# Patient Record
Sex: Female | Born: 1970 | Race: White | Hispanic: Yes | Marital: Married | Smoking: Never smoker
Health system: Southern US, Community
[De-identification: ages and names within clinical notes are randomized; demographics above are authoritative.]

## PROBLEM LIST (undated history)

## (undated) DIAGNOSIS — D649 Anemia, unspecified: Secondary | ICD-10-CM

## (undated) DIAGNOSIS — D219 Benign neoplasm of connective and other soft tissue, unspecified: Secondary | ICD-10-CM

## (undated) HISTORY — PX: TUBAL LIGATION: SHX77

## (undated) HISTORY — PX: HERNIA REPAIR: SHX51

## (undated) HISTORY — DX: Benign neoplasm of connective and other soft tissue, unspecified: D21.9

## (undated) HISTORY — DX: Anemia, unspecified: D64.9

---

## 2007-07-20 ENCOUNTER — Emergency Department (HOSPITAL_COMMUNITY): Admission: EM | Admit: 2007-07-20 | Discharge: 2007-07-20 | Payer: Self-pay | Admitting: Emergency Medicine

## 2010-09-04 ENCOUNTER — Other Ambulatory Visit: Payer: Self-pay | Admitting: *Deleted

## 2010-09-19 ENCOUNTER — Other Ambulatory Visit (HOSPITAL_COMMUNITY): Payer: Self-pay | Admitting: Family Medicine

## 2010-09-19 DIAGNOSIS — Z1231 Encounter for screening mammogram for malignant neoplasm of breast: Secondary | ICD-10-CM

## 2010-09-20 ENCOUNTER — Other Ambulatory Visit (HOSPITAL_COMMUNITY): Payer: Self-pay | Admitting: Family Medicine

## 2010-09-20 DIAGNOSIS — Z1231 Encounter for screening mammogram for malignant neoplasm of breast: Secondary | ICD-10-CM

## 2010-09-23 ENCOUNTER — Other Ambulatory Visit (HOSPITAL_COMMUNITY): Payer: Self-pay | Admitting: *Deleted

## 2010-09-23 DIAGNOSIS — Z1239 Encounter for other screening for malignant neoplasm of breast: Secondary | ICD-10-CM

## 2010-09-26 ENCOUNTER — Ambulatory Visit (HOSPITAL_COMMUNITY)
Admission: RE | Admit: 2010-09-26 | Discharge: 2010-09-26 | Disposition: A | Discharge status: Home / Self Care | Payer: Self-pay | Source: Ambulatory Visit | Attending: Family Medicine | Admitting: Family Medicine

## 2010-09-26 ENCOUNTER — Ambulatory Visit (HOSPITAL_COMMUNITY): Payer: Self-pay

## 2010-09-26 DIAGNOSIS — Z1231 Encounter for screening mammogram for malignant neoplasm of breast: Secondary | ICD-10-CM

## 2012-09-02 ENCOUNTER — Other Ambulatory Visit (HOSPITAL_COMMUNITY): Payer: Self-pay | Admitting: *Deleted

## 2012-09-02 ENCOUNTER — Other Ambulatory Visit (HOSPITAL_COMMUNITY): Payer: Self-pay | Admitting: Family Medicine

## 2012-09-02 DIAGNOSIS — Z1231 Encounter for screening mammogram for malignant neoplasm of breast: Secondary | ICD-10-CM

## 2012-09-09 ENCOUNTER — Ambulatory Visit (HOSPITAL_COMMUNITY)
Admission: RE | Admit: 2012-09-09 | Discharge: 2012-09-09 | Disposition: A | Discharge status: Home / Self Care | Payer: Self-pay | Source: Ambulatory Visit | Attending: Family Medicine | Admitting: Family Medicine

## 2012-09-09 DIAGNOSIS — Z1231 Encounter for screening mammogram for malignant neoplasm of breast: Secondary | ICD-10-CM

## 2012-09-13 ENCOUNTER — Other Ambulatory Visit: Payer: Self-pay | Admitting: Family Medicine

## 2012-09-13 DIAGNOSIS — R928 Other abnormal and inconclusive findings on diagnostic imaging of breast: Secondary | ICD-10-CM

## 2012-09-22 ENCOUNTER — Ambulatory Visit (HOSPITAL_COMMUNITY)
Admission: RE | Admit: 2012-09-22 | Discharge: 2012-09-22 | Disposition: A | Discharge status: Home / Self Care | Payer: Self-pay | Source: Ambulatory Visit | Attending: Family Medicine | Admitting: Family Medicine

## 2012-09-22 DIAGNOSIS — R928 Other abnormal and inconclusive findings on diagnostic imaging of breast: Secondary | ICD-10-CM | POA: Insufficient documentation

## 2013-10-11 ENCOUNTER — Other Ambulatory Visit (HOSPITAL_COMMUNITY): Payer: Self-pay | Admitting: Physician Assistant

## 2013-10-11 DIAGNOSIS — Z1231 Encounter for screening mammogram for malignant neoplasm of breast: Secondary | ICD-10-CM

## 2013-10-18 ENCOUNTER — Ambulatory Visit (HOSPITAL_COMMUNITY)
Admission: RE | Admit: 2013-10-18 | Discharge: 2013-10-18 | Disposition: A | Discharge status: Home / Self Care | Payer: Self-pay | Source: Ambulatory Visit | Attending: Physician Assistant | Admitting: Physician Assistant

## 2013-10-18 DIAGNOSIS — Z1231 Encounter for screening mammogram for malignant neoplasm of breast: Secondary | ICD-10-CM

## 2014-09-27 ENCOUNTER — Other Ambulatory Visit (HOSPITAL_COMMUNITY): Payer: Self-pay | Admitting: Physician Assistant

## 2014-09-27 DIAGNOSIS — Z1213 Encounter for screening for malignant neoplasm of small intestine: Secondary | ICD-10-CM

## 2014-10-26 ENCOUNTER — Ambulatory Visit (HOSPITAL_COMMUNITY): Payer: Self-pay

## 2014-11-02 ENCOUNTER — Ambulatory Visit (HOSPITAL_COMMUNITY)
Admission: RE | Admit: 2014-11-02 | Discharge: 2014-11-02 | Disposition: A | Discharge status: Home / Self Care | Payer: Self-pay | Source: Ambulatory Visit | Attending: Physician Assistant | Admitting: Physician Assistant

## 2014-11-02 DIAGNOSIS — Z1213 Encounter for screening for malignant neoplasm of small intestine: Secondary | ICD-10-CM

## 2015-09-06 ENCOUNTER — Ambulatory Visit: Payer: Self-pay | Admitting: Physician Assistant

## 2015-09-11 ENCOUNTER — Encounter: Payer: Self-pay | Admitting: Physician Assistant

## 2015-10-03 ENCOUNTER — Ambulatory Visit: Payer: Self-pay | Admitting: Physician Assistant

## 2015-10-10 ENCOUNTER — Encounter: Payer: Self-pay | Admitting: Physician Assistant

## 2015-10-10 ENCOUNTER — Ambulatory Visit: Payer: Self-pay | Admitting: Physician Assistant

## 2015-10-10 VITALS — BP 102/68 | HR 81 | Temp 98.1°F | Ht 60.5 in | Wt 143.6 lb

## 2015-10-10 DIAGNOSIS — K5900 Constipation, unspecified: Secondary | ICD-10-CM

## 2015-10-10 DIAGNOSIS — D649 Anemia, unspecified: Secondary | ICD-10-CM

## 2015-10-10 DIAGNOSIS — M542 Cervicalgia: Secondary | ICD-10-CM

## 2015-10-10 DIAGNOSIS — N921 Excessive and frequent menstruation with irregular cycle: Secondary | ICD-10-CM

## 2015-10-10 DIAGNOSIS — Z1239 Encounter for other screening for malignant neoplasm of breast: Secondary | ICD-10-CM

## 2015-10-10 NOTE — Patient Instructions (Addendum)
Start over-the-counter IRON supplement and take it every day empieze a tomar hiero todos los dias   Anemia inespecfica (Anemia, Nonspecific) La anemia es una enfermedad en la que la concentracin de glbulos rojos o el nivel de hemoglobina en la sangre estn por debajo de lo normal. La hemoglobina es la sustancia de los glbulos rojos que lleva el oxgeno a todo el cuerpo. La anemia da como resultado que los tejidos no reciban la cantidad suficiente de oxgeno.  CAUSAS  Las causas ms frecuentes de anemia son:   Elvina Mattes. El sangrado puede ser interno o externo. Incluye sangrado excesivo debido al perodo (en las mujeres) o por los intestinos.   Dficit nutricional.   Enfermedad renal, tiroidea o heptica crnicas.  Enfermedades de la mdula sea que disminuyen la produccin de glbulos rojos.  Cncer y tratamientos para Science writer.  VIH, sida y sus tratamientos.  Trastornos del bazo que aumentan la destruccin de glbulos rojos.  Enfermedades de [employer].  Destruccin excesiva de glbulos rojos debido a una infeccin, a medicamentos y a Nurse, mental health. SIGNOS Y SNTOMAS   Debilidad leve.   Mareos.   Dolor de [place].  Palpitaciones.   Falta de aire, especialmente con el ejercicio.   Palidez.  Sensibilidad al fro.  Indigestin.  Nuseas.  Dificultad para dormir.  Dificultad para concentrarse. Los sntomas pueden ocurrir repentinamente o pueden Psychologist, forensic.  DIAGNSTICO  Con frecuencia es necesario realizar anlisis de [employer]. Estos ayudan al profesional a Adult nurse. Su mdico controlar la materia fecal para Hydrographic surveyor la presencia de [place] y buscar otras causas de prdida de [place].  TRATAMIENTO  El tratamiento vara segn la causa de la anemia. Las opciones de tratamiento son:   Suplementos de hierro, vitamina [id number], o cido flico.   Medicamentos con hormonas.   Transfusin de  [place]. Ser necesaria en los casos de prdida de [place] grave.   Hospitalizacin. Ser necesaria si la prdida de sangre es continua y significativa.   Cambios en la dieta.  Extirpacin del bazo. INSTRUCCIONES PARA EL CUIDADO EN EL HOGAR Cumpla con todas las visitas de control. Generalmente demora varias semanas corregir la anemia, y es muy importante que el mdico controle su enfermedad y su respuesta al [place]. SOLICITE ATENCIN MDICA DE INMEDIATO SI:   Siente debilidad extrema, falta de aire o dolor en el pecho.   Se siente mareado o tiene dificultad para concentrarse.  Tiene una hemorragia vaginal abundante.   Aparece una erupcin cutnea.   La materia fecal es negra, de aspecto alquitranado.   Se desmaya.   Vomita sangre.   Vomita repetidas veces.   Siente dolor abdominal.  Tiene fiebre o sntomas persistentes durante ms de 2 - 3 das.   Tiene fiebre y los sntomas empeoran repentinamente.   Se deshidrata.  ASEGRESE DE QUE:  Comprende estas instrucciones.  Controlar su afeccin.  Recibir ayuda de inmediato si no mejora o si empeora.   Esta informacin no tiene Marine scientist el consejo del mdico. Asegrese de hacerle al mdico cualquier pregunta que tenga.   Document Released: 06/02/2005 Document Revised: 02/02/2013 Elsevier Interactive Patient Education 2016 [place] (Constipation, Adult) Estreimiento significa que una persona tiene menos de tres evacuaciones en una semana, dificultad para defecar, o que las heces son secas, duras, o ms grandes que lo normal. A medida que envejecemos el estreimiento es ms comn. Una dieta baja en fibra, no tomar suficientes lquidos y el  uso de ciertos medicamentos pueden Agricultural engineer.  CAUSAS   Ciertos medicamentos, como los antidepresivos, analgsicos, suplementos de hierro, anticidos y diurticos.  Algunas enfermedades, como la diabetes, el  sndrome del colon irritable, enfermedad de la tiroides, o depresin.  No beber suficiente agua.  No consumir suficientes alimentos ricos en fibra.  Situaciones de estrs o viajes.  Falta de actividad fsica o de ejercicio.  Ignorar la necesidad sbita de Landscape architect.  Uso en exceso de laxantes. SIGNOS Y SNTOMAS   Defecar menos de tres veces por semana.  Dificultad para defecar.  Tener las heces secas y duras, o ms grandes que las normales.  Sensacin de estar lleno o hinchado.  Dolor en la parte baja del abdomen.  No sentir alivio despus de defecar. DIAGNSTICO  El mdico le har una historia clnica y un examen fsico. Pueden hacerle exmenes adicionales para el estreimiento grave. Estos estudios pueden ser:  Un radiografa con enema de bario para examinar el recto, el colon y, en algunos casos, el intestino delgado.  Una sigmoidoscopia para examinar el colon inferior.  Una colonoscopia para examinar todo el colon. TRATAMIENTO  El tratamiento depender de la gravedad del estreimiento y de la causa. Algunos tratamientos nutricionales son beber ms lquidos y comer ms alimentos ricos en fibra. El cambio en el estilo de vida incluye hacer ejercicios de [place] regular. Si estas recomendaciones para Animator dieta y en el estilo de vida no ayudan, el mdico le puede indicar el uso de laxantes de venta libre para ayudarlo a Landscape architect. Los medicamentos recetados se pueden prescribir si los medicamentos de venta libre no lo [place].  INSTRUCCIONES PARA EL CUIDADO EN EL HOGAR   Consuma alimentos con alto contenido de [place], como frutas, vegetales, cereales integrales y porotos.  Limite los alimentos procesados ricos en grasas y azcar, como las papas fritas, hamburguesas, galletas, dulces y refrescos.  Puede agregar un suplemento de fibra a su dieta si no obtiene lo suficiente de los alimentos.  Beba suficiente lquido para Consulting civil engineer orina clara o de color amarillo  plido.  Haga ejercicio regularmente o segn las indicaciones del mdico.  Vaya al bao cuando sienta la necesidad de ir. No se aguante las ganas.  Tome solo medicamentos de venta libre o recetados, segn las indicaciones del mdico. No tome otros medicamentos para el estreimiento sin consultarlo antes con su mdico. SOLICITE ATENCIN MDICA DE INMEDIATO SI:   Observa sangre brillante en las heces.  El estreimiento dura ms de 4 das o [place].  Siente dolor abdominal o rectal.  Las heces son delgadas como un lpiz.  Pierde peso de [place] inexplicable. ASEGRESE DE QUE:   Comprende estas instrucciones.  Controlar su afeccin.  Recibir ayuda de inmediato si no mejora o si empeora.   Esta informacin no tiene Marine scientist el consejo del mdico. Asegrese de hacerle al mdico cualquier pregunta que tenga.   Document Released: 06/22/2007 Document Revised: 06/23/2014 Elsevier Interactive Patient Education [employer].

## 2015-10-10 NOTE — Progress Notes (Signed)
BP 102/68 mmHg  Pulse 81  Temp(Src) 98.1 F (36.7 C)  Ht 5' 0.5" (1.537 m)  Wt 143 lb 9.6 oz (65.137 kg)  BMI 27.57 kg/m2  SpO2 98%   Subjective:    Patient ID: Judy Mcdaniel, female    DOB: [birth year], 45 y.o.   MRN: [id number]  HPI: Judy Mcdaniel is a 45 y.o. female presenting on 10/10/2015 for Follow-up   HPI   Pt requesting rx for IBU and flexeril that she got in the ER (in [place]) last year.  States has neck pain at times   Pt c/o constipation.  States she  has BM qod.     Relevant past medical, surgical, family and social history reviewed and updated as indicated. Interim medical history since our last visit reviewed. Allergies and medications reviewed and updated.  Current outpatient prescriptions:  .  ASPIRIN-ACETAMINOPHEN-CAFFEINE PO, Take by mouth., Disp: , Rfl:    Review of Systems  Constitutional: Negative for fever, chills, diaphoresis, appetite change, fatigue and unexpected weight change.  HENT: Positive for dental problem. Negative for congestion, drooling, ear pain, facial swelling, hearing loss, mouth sores, sneezing, sore throat, trouble swallowing and voice change.   Eyes: Positive for redness and itching. Negative for pain, discharge and visual disturbance.  Respiratory: Negative for cough, choking, shortness of breath and wheezing.   Cardiovascular: Negative for chest pain, palpitations and leg swelling.  Gastrointestinal: Positive for constipation. Negative for vomiting, abdominal pain, diarrhea and blood in stool.  Endocrine: Negative for cold intolerance, heat intolerance and polydipsia.  Genitourinary: Negative for dysuria, hematuria and decreased urine volume.  Musculoskeletal: Positive for back pain. Negative for arthralgias and gait problem.  Skin: Negative for rash.  Allergic/Immunologic: Negative for environmental allergies.  Neurological: Negative for seizures, syncope, light-headedness and headaches.  Hematological: Negative for  adenopathy.  Psychiatric/Behavioral: Negative for suicidal ideas, dysphoric mood and agitation. The patient is nervous/anxious.     Per HPI unless specifically indicated above     Objective:    BP 102/68 mmHg  Pulse 81  Temp(Src) 98.1 F (36.7 C)  Ht 5' 0.5" (1.537 m)  Wt 143 lb 9.6 oz (65.137 kg)  BMI 27.57 kg/m2  SpO2 98%  Wt Readings from Last 3 Encounters:  10/10/15 143 lb 9.6 oz (65.137 kg)    Physical Exam  Constitutional: She is oriented to person, place, and time. She appears well-developed and well-nourished.  HENT:  Head: Normocephalic and atraumatic.  Neck: Neck supple.  Cardiovascular: Normal rate and regular rhythm.   Pulmonary/Chest: Effort normal and breath sounds normal.  Abdominal: Soft. Bowel sounds are normal. She exhibits no mass. There is no hepatosplenomegaly. There is no tenderness.  Musculoskeletal: She exhibits no edema.       Cervical back: Normal. She exhibits normal range of motion, no tenderness, no bony tenderness, no swelling, no edema and no deformity.  Lymphadenopathy:    She has no cervical adenopathy.  Neurological: She is alert and oriented to person, place, and time.  Skin: Skin is warm and dry.  Psychiatric: She has a normal mood and affect. Her behavior is normal.  Vitals reviewed.   No results found for this or any previous visit.    Assessment & Plan:   Encounter Diagnoses  Name Primary?  Marland Kitchen Anemia, unspecified anemia type Yes  . Constipation, unspecified constipation type   . Menorrhagia with irregular cycle   . Screening for breast cancer   . Neck pain      -Counseled on  constipation and gave handout . Recommended increasing water intake and adding fiber -mammogram after may 20 -Reviewed labs (not in EPIC- were faxed from [facility])-  Hg 7.8, lipids good-  - pt states no blood in stool, menses heavy- lasts 7 days-  First 2 days are heaviest.   She has to use tampon and Pad because the tampon won't last and starts  overflowing onto the pad after 2-4 hours.    LMP last week.  She gets her period every 3 weeks.   -Start iron. Refer to gyn.  Pt is given a [facility] discount application -recommend heat and tylenol for pain.  No flexeril.  nsaids not recommended in light of anemia -F/u 1 month

## 2015-10-31 ENCOUNTER — Other Ambulatory Visit: Payer: Self-pay | Admitting: Student

## 2015-10-31 DIAGNOSIS — D649 Anemia, unspecified: Secondary | ICD-10-CM

## 2015-11-05 ENCOUNTER — Encounter (HOSPITAL_COMMUNITY): Payer: Self-pay

## 2015-11-08 ENCOUNTER — Ambulatory Visit (HOSPITAL_COMMUNITY): Payer: Self-pay

## 2015-11-08 ENCOUNTER — Other Ambulatory Visit: Payer: Self-pay | Admitting: Physician Assistant

## 2015-11-08 ENCOUNTER — Encounter: Payer: Self-pay | Admitting: Physician Assistant

## 2015-11-08 ENCOUNTER — Encounter (HOSPITAL_COMMUNITY): Payer: Self-pay

## 2015-11-08 ENCOUNTER — Ambulatory Visit (HOSPITAL_COMMUNITY)
Admission: RE | Admit: 2015-11-08 | Discharge: 2015-11-08 | Disposition: A | Discharge status: Home / Self Care | Payer: Self-pay | Source: Ambulatory Visit | Attending: Physician Assistant | Admitting: Physician Assistant

## 2015-11-08 ENCOUNTER — Ambulatory Visit: Payer: Self-pay | Admitting: Physician Assistant

## 2015-11-08 VITALS — BP 114/70 | HR 73 | Temp 97.9°F | Ht 60.5 in | Wt 146.8 lb

## 2015-11-08 DIAGNOSIS — N921 Excessive and frequent menstruation with irregular cycle: Secondary | ICD-10-CM

## 2015-11-08 DIAGNOSIS — Z1231 Encounter for screening mammogram for malignant neoplasm of breast: Secondary | ICD-10-CM

## 2015-11-08 DIAGNOSIS — D649 Anemia, unspecified: Secondary | ICD-10-CM

## 2015-11-08 NOTE — Progress Notes (Signed)
BP 114/70 mmHg  Pulse 73  Temp(Src) 97.9 F (36.6 C)  Ht 5' 0.5" (1.537 m)  Wt 146 lb 12.8 oz (66.588 kg)  BMI 28.19 kg/m2  SpO2 99%   Subjective:    Patient ID: Judy Mcdaniel, female    DOB: 1971/03/18, 45 y.o.   MRN: [id number]  HPI: Judy Mcdaniel is a 45 y.o. female presenting on 11/08/2015 for Anemia   HPI Pt says she went to lab and got blood drawn on Saturday.  Pt says she went to turn in [employer] disc application twice but Caryl Pina wasn't there  Relevant past medical, surgical, family and social history reviewed and updated as indicated. Interim medical history since our last visit reviewed. Allergies and medications reviewed and updated.   Current outpatient prescriptions:  .  ASPIRIN-ACETAMINOPHEN-CAFFEINE PO, Take by mouth., Disp: , Rfl:  .  IRON PO, Take 1 tablet by mouth daily., Disp: , Rfl:    Review of Systems  Constitutional: Positive for fatigue. Negative for fever, chills, diaphoresis, appetite change and unexpected weight change.  HENT: Positive for dental problem and sneezing. Negative for congestion, drooling, ear pain, facial swelling, hearing loss, mouth sores, sore throat, trouble swallowing and voice change.   Eyes: Positive for redness and itching. Negative for pain, discharge and visual disturbance.  Respiratory: Negative for cough, choking, shortness of breath and wheezing.   Cardiovascular: Negative for chest pain, palpitations and leg swelling.  Gastrointestinal: Negative for vomiting, abdominal pain, diarrhea, constipation and blood in stool.  Endocrine: Negative for cold intolerance, heat intolerance and polydipsia.  Genitourinary: Negative for dysuria, hematuria and decreased urine volume.  Musculoskeletal: Positive for back pain. Negative for arthralgias and gait problem.  Skin: Negative for rash.  Allergic/Immunologic: Negative for environmental allergies.  Neurological: Negative for seizures, syncope, light-headedness and headaches.   Hematological: Negative for adenopathy.  Psychiatric/Behavioral: Negative for suicidal ideas, dysphoric mood and agitation. The patient is not nervous/anxious.     Per HPI unless specifically indicated above     Objective:    BP 114/70 mmHg  Pulse 73  Temp(Src) 97.9 F (36.6 C)  Ht 5' 0.5" (1.537 m)  Wt 146 lb 12.8 oz (66.588 kg)  BMI 28.19 kg/m2  SpO2 99%  Wt Readings from Last 3 Encounters:  11/08/15 146 lb 12.8 oz (66.588 kg)  10/10/15 143 lb 9.6 oz (65.137 kg)    Physical Exam  Constitutional: She is oriented to person, place, and time. She appears well-developed and well-nourished.  HENT:  Head: Normocephalic and atraumatic.  Neck: Neck supple.  Cardiovascular: Normal rate and regular rhythm.   Pulmonary/Chest: Effort normal and breath sounds normal.  Abdominal: Soft. Bowel sounds are normal. She exhibits no mass. There is no hepatosplenomegaly. There is no tenderness.  Musculoskeletal: She exhibits no edema.  Lymphadenopathy:    She has no cervical adenopathy.  Neurological: She is alert and oriented to person, place, and time.  Skin: Skin is warm and dry.  Psychiatric: She has a normal mood and affect. Her behavior is normal.  Vitals reviewed.   No results found for this or any previous visit.    Assessment & Plan:    Encounter Diagnoses  Name Primary?  Marland Kitchen Anemia, unspecified anemia type Yes  . Menorrhagia with irregular cycle     -reviewed labs with pt- not in EPIC- Hg 11.5.  Also lipids good and CMP good -pt to Continue iron -pt to Turn in [facility] discount application and notify us  -F/u 4 months.  RTO  sooner prn

## 2015-11-28 ENCOUNTER — Encounter: Payer: Self-pay | Admitting: Physician Assistant

## 2016-02-27 ENCOUNTER — Other Ambulatory Visit: Payer: Self-pay | Admitting: Student

## 2016-02-27 DIAGNOSIS — N921 Excessive and frequent menstruation with irregular cycle: Secondary | ICD-10-CM

## 2016-02-27 DIAGNOSIS — D649 Anemia, unspecified: Secondary | ICD-10-CM

## 2016-03-01 LAB — HEMOGLOBIN: Hemoglobin: 14.2 g/dL (ref 11.7–15.5)

## 2016-03-01 LAB — HEMATOCRIT: HCT: 42.9 % (ref 35.0–45.0)

## 2016-03-05 ENCOUNTER — Encounter: Payer: Self-pay | Admitting: Physician Assistant

## 2016-03-05 ENCOUNTER — Ambulatory Visit: Payer: Self-pay | Admitting: Physician Assistant

## 2016-03-05 VITALS — BP 120/68 | HR 87 | Temp 97.9°F | Ht 60.5 in | Wt 147.0 lb

## 2016-03-05 DIAGNOSIS — D649 Anemia, unspecified: Secondary | ICD-10-CM

## 2016-03-05 DIAGNOSIS — N921 Excessive and frequent menstruation with irregular cycle: Secondary | ICD-10-CM

## 2016-03-05 NOTE — Progress Notes (Signed)
BP 120/68 (BP Location: Left Arm, Patient Position: Sitting, Cuff Size: Normal)   Pulse 87   Temp 97.9 F (36.6 C)   Ht 5' 0.5" (1.537 m)   Wt 147 lb (66.7 kg)   SpO2 98%   BMI 28.24 kg/m    Subjective:    Patient ID: Judy Mcdaniel, female    DOB: [birth year], 45 y.o.   MRN: [id number]  HPI: Judy Mcdaniel is a 45 y.o. female presenting on 03/05/2016 for Anemia   HPI   Pt is doing well.  She asks about her heavy menses and pt is reminded that she was supposed to get [facility] discount application turned in and notify office so she could be scheduled with gyn and she did not do this.  Referral made for her in April.  Relevant past medical, surgical, family and social history reviewed and updated as indicated. Interim medical history since our last visit reviewed. Allergies and medications reviewed and updated.   Current Outpatient Prescriptions:  .  IRON PO, Take 1 tablet by mouth daily., Disp: , Rfl:  .  ASPIRIN-ACETAMINOPHEN-CAFFEINE PO, Take by mouth., Disp: , Rfl:    Review of Systems  Constitutional: Negative for appetite change, chills, diaphoresis, fatigue, fever and unexpected weight change.  HENT: Positive for dental problem. Negative for congestion, drooling, ear pain, facial swelling, hearing loss, mouth sores, sneezing, sore throat, trouble swallowing and voice change.   Eyes: Positive for itching. Negative for pain, discharge, redness and visual disturbance.  Respiratory: Negative for cough, choking, shortness of breath and wheezing.   Cardiovascular: Negative for chest pain, palpitations and leg swelling.  Gastrointestinal: Positive for constipation. Negative for abdominal pain, blood in stool, diarrhea and vomiting.  Endocrine: Negative for cold intolerance, heat intolerance and polydipsia.  Genitourinary: Negative for decreased urine volume, dysuria and hematuria.  Musculoskeletal: Positive for back pain. Negative for arthralgias and gait problem.  Skin:  Negative for rash.  Allergic/Immunologic: Negative for environmental allergies.  Neurological: Positive for headaches. Negative for seizures, syncope and light-headedness.  Hematological: Negative for adenopathy.  Psychiatric/Behavioral: Negative for agitation, dysphoric mood and suicidal ideas. The patient is nervous/anxious.     Per HPI unless specifically indicated above     Objective:    BP 120/68 (BP Location: Left Arm, Patient Position: Sitting, Cuff Size: Normal)   Pulse 87   Temp 97.9 F (36.6 C)   Ht 5' 0.5" (1.537 m)   Wt 147 lb (66.7 kg)   SpO2 98%   BMI 28.24 kg/m   Wt Readings from Last 3 Encounters:  03/05/16 147 lb (66.7 kg)  11/08/15 146 lb 12.8 oz (66.6 kg)  10/10/15 143 lb 9.6 oz (65.1 kg)    Physical Exam  Constitutional: She is oriented to person, place, and time. She appears well-developed and well-nourished.  HENT:  Head: Normocephalic and atraumatic.  Neck: Neck supple.  Cardiovascular: Normal rate and regular rhythm.   Pulmonary/Chest: Effort normal and breath sounds normal.  Abdominal: Soft. Bowel sounds are normal. She exhibits no mass. There is no hepatosplenomegaly. There is no tenderness.  Musculoskeletal: She exhibits no edema.  Lymphadenopathy:    She has no cervical adenopathy.  Neurological: She is alert and oriented to person, place, and time.  Skin: Skin is warm and dry.  Psychiatric: She has a normal mood and affect. Her behavior is normal.  Vitals reviewed.   Results for orders placed or performed in visit on 02/27/16  Hematocrit  Result Value Ref Range   HCT  42.9 35.0 - 45.0 %  Hemoglobin  Result Value Ref Range   Hemoglobin 14.2 11.7 - 15.5 g/dL      Assessment & Plan:   Encounter Diagnoses  Name Primary?  Marland Kitchen Anemia, unspecified anemia type Yes  . Menorrhagia with irregular cycle     -reviewed labs with pt.  She is to Continue taking iron but she Can cut back to every other day if desired -F/u 6 weeks for pap

## 2016-03-10 ENCOUNTER — Ambulatory Visit: Payer: Self-pay | Admitting: Physician Assistant

## 2016-03-17 ENCOUNTER — Ambulatory Visit: Payer: Self-pay | Admitting: Physician Assistant

## 2016-04-16 ENCOUNTER — Ambulatory Visit: Payer: Self-pay | Admitting: Physician Assistant

## 2016-05-06 ENCOUNTER — Ambulatory Visit: Payer: Self-pay | Admitting: Physician Assistant

## 2016-06-25 ENCOUNTER — Ambulatory Visit: Payer: Self-pay | Admitting: Physician Assistant

## 2016-06-30 ENCOUNTER — Ambulatory Visit: Payer: Self-pay | Admitting: Physician Assistant

## 2016-06-30 ENCOUNTER — Encounter: Payer: Self-pay | Admitting: Physician Assistant

## 2016-06-30 ENCOUNTER — Other Ambulatory Visit: Payer: Self-pay | Admitting: Physician Assistant

## 2016-06-30 VITALS — BP 108/70 | HR 89 | Temp 98.2°F | Ht 60.5 in | Wt 142.2 lb

## 2016-06-30 DIAGNOSIS — Z124 Encounter for screening for malignant neoplasm of cervix: Secondary | ICD-10-CM

## 2016-06-30 DIAGNOSIS — N921 Excessive and frequent menstruation with irregular cycle: Secondary | ICD-10-CM

## 2016-06-30 DIAGNOSIS — D649 Anemia, unspecified: Secondary | ICD-10-CM

## 2016-06-30 NOTE — Progress Notes (Signed)
BP 108/70 (BP Location: Left Arm, Patient Position: Sitting, Cuff Size: Normal)   Pulse 89   Temp 98.2 F (36.8 C)   Ht 5' 0.5" (1.537 m)   Wt 142 lb 4 oz (64.5 kg)   SpO2 98%   BMI 27.32 kg/m    Subjective:    Patient ID: Judy Mcdaniel, female    DOB: [birth year], 46 y.o.   MRN: [id number]  HPI: Makailee Nitchman is a 46 y.o. female presenting on 06/30/2016 for No chief complaint on file.   HPI   LMP about 2 wk ago.  She is doing well except that it seems like her menses are getting closer and closer.  Says she is spotting now and thinks she will be starting again even though LMP only 2 wk ago  Relevant past medical, surgical, family and social history reviewed and updated as indicated. Interim medical history since our last visit reviewed. Allergies and medications reviewed and updated.  No current outpatient prescriptions on file.   Review of Systems  Constitutional: Negative for appetite change, chills, diaphoresis, fatigue, fever and unexpected weight change.  HENT: Positive for dental problem and sneezing. Negative for congestion, drooling, ear pain, facial swelling, hearing loss, mouth sores, sore throat, trouble swallowing and voice change.   Eyes: Positive for redness and itching. Negative for pain, discharge and visual disturbance.  Respiratory: Positive for cough. Negative for choking, shortness of breath and wheezing.   Cardiovascular: Negative for chest pain, palpitations and leg swelling.  Gastrointestinal: Positive for constipation. Negative for abdominal pain, blood in stool, diarrhea and vomiting.  Endocrine: Positive for cold intolerance and heat intolerance. Negative for polydipsia.  Genitourinary: Negative for decreased urine volume, dysuria and hematuria.  Musculoskeletal: Positive for back pain. Negative for arthralgias and gait problem.  Skin: Negative for rash.  Allergic/Immunologic: Negative for environmental allergies.  Neurological: Positive for  headaches. Negative for seizures, syncope and light-headedness.  Hematological: Negative for adenopathy.  Psychiatric/Behavioral: Negative for agitation, dysphoric mood and suicidal ideas. The patient is nervous/anxious.     Per HPI unless specifically indicated above     Objective:    BP 108/70 (BP Location: Left Arm, Patient Position: Sitting, Cuff Size: Normal)   Pulse 89   Temp 98.2 F (36.8 C)   Ht 5' 0.5" (1.537 m)   Wt 142 lb 4 oz (64.5 kg)   SpO2 98%   BMI 27.32 kg/m   Wt Readings from Last 3 Encounters:  06/30/16 142 lb 4 oz (64.5 kg)  03/05/16 147 lb (66.7 kg)  11/08/15 146 lb 12.8 oz (66.6 kg)    Physical Exam  Constitutional: She is oriented to person, place, and time. She appears well-developed and well-nourished.  Pulmonary/Chest: Effort normal.  Breast exam normal  Abdominal: Soft. She exhibits no mass. There is no tenderness. There is no rebound and no guarding.  Genitourinary: Vagina normal and uterus normal. No breast swelling, tenderness, discharge or bleeding. There is no rash, tenderness or lesion on the right labia. There is no rash, tenderness or lesion on the left labia. Cervix exhibits no motion tenderness, no discharge and no friability. Right adnexum displays no mass, no tenderness and no fullness. Left adnexum displays no mass, no tenderness and no fullness.  Genitourinary Comments: Small amount blood in vagina.  (nurse Berenice assisted)  Neurological: She is alert and oriented to person, place, and time.  Skin: Skin is warm and dry.  Psychiatric: She has a normal mood and affect. Her behavior is normal.  Nursing note and vitals reviewed.       Assessment & Plan:    Encounter Diagnoses  Name Primary?  . Routine Papanicolaou smear Yes  . Anemia, unspecified type   . Menorrhagia with irregular cycle     -check h/h today when leaves office -follow up 3 months.  RTO sooner prn

## 2016-07-01 LAB — PAP, THINPREP RFLX HPV

## 2016-07-05 LAB — HEMATOCRIT: HEMATOCRIT: 38.4 % (ref 35.0–45.0)

## 2016-07-05 LAB — HEMOGLOBIN: Hemoglobin: 11.9 g/dL (ref 11.7–15.5)

## 2016-09-22 ENCOUNTER — Encounter: Payer: Self-pay | Admitting: Physician Assistant

## 2016-09-22 ENCOUNTER — Ambulatory Visit: Payer: Self-pay | Admitting: Physician Assistant

## 2016-09-22 VITALS — BP 122/78 | HR 66 | Temp 97.7°F | Ht 60.5 in | Wt 144.5 lb

## 2016-09-22 DIAGNOSIS — K5900 Constipation, unspecified: Secondary | ICD-10-CM

## 2016-09-22 DIAGNOSIS — Z13220 Encounter for screening for lipoid disorders: Secondary | ICD-10-CM

## 2016-09-22 DIAGNOSIS — N921 Excessive and frequent menstruation with irregular cycle: Secondary | ICD-10-CM

## 2016-09-22 DIAGNOSIS — Z91199 Patient's noncompliance with other medical treatment and regimen due to unspecified reason: Secondary | ICD-10-CM

## 2016-09-22 DIAGNOSIS — Z9119 Patient's noncompliance with other medical treatment and regimen: Secondary | ICD-10-CM

## 2016-09-22 DIAGNOSIS — D649 Anemia, unspecified: Secondary | ICD-10-CM

## 2016-09-22 LAB — HEMOGLOBIN: HEMOGLOBIN: 12.1 g/dL (ref 11.7–15.5)

## 2016-09-22 LAB — HEMATOCRIT: HCT: 38.3 % (ref 35.0–45.0)

## 2016-09-22 NOTE — Patient Instructions (Addendum)
Constipacin en los adultos (Constipation, Adult) Constipacin significa que una persona defeca en una semana menos que lo normal, hay dificultad para defecar, o las heces son secas, duras, o ms grandes que lo normal. La causa puede ser una afeccin subyacente. Puede empeorar con la edad si una persona toma ciertos medicamentos y no toma suficiente lquido. INSTRUCCIONES PARA EL CUIDADO EN EL HOGAR Comida y bebida  Consuma alimentos con alto contenido de [place], como frutas y verduras frescas, cereales integrales y frijoles.  Limite los alimentos ricos en grasas y con bajo contenido de fibra, o muy procesados, como las papas fritas, [place], [place], dulces y refrescos.  Beba suficiente lquido para Consulting civil engineer orina clara o de color amarillo plido. Instrucciones generales  Haga actividad fsica habitualmente o como se lo haya indicado el mdico.  Vaya al bao cuando sienta la necesidad de ir. No se aguante las ganas.  Tome los medicamentos de venta libre y los recetados solamente como se lo haya indicado el mdico. Estos incluyen los suplementos de [place].  Practique ejercicios de rehabilitacin del suelo plvico, como la respiracin profunda mientras relaja la parte inferior del abdomen y relajacin del suelo plvico mientras defeca.  Controle su afeccin para ver si hay cambios.  Concurra a todas las visitas de control como se lo haya indicado el mdico. Esto es importante. SOLICITE ATENCIN MDICA SI:  Siente un dolor que empeora.  Tiene fiebre.  No defeca despus de 4das.  Vomita.  No tiene hambre.  Pierde peso.  Tiene una hemorragia en el ano.  Las heces son delgadas como un lpiz. SOLICITE ATENCIN MDICA DE INMEDIATO SI:  Tiene fiebre y los sntomas empeoran repentinamente.  Observa que se filtran heces o hay sangre en las heces.  Tiene el abdomen hinchado.  Siente un dolor intenso en el abdomen.  Se siente mareado o se desmaya. Esta informacin no  tiene Marine scientist el consejo del mdico. Asegrese de hacerle al mdico cualquier pregunta que tenga. Document Released: 06/22/2007 Document Revised: 06/23/2014 Document Reviewed: 11/21/2015 Elsevier Interactive Patient Education  2017 [employer].

## 2016-09-22 NOTE — Progress Notes (Signed)
BP 122/78 (BP Location: Left Arm, Patient Position: Sitting, Cuff Size: Normal)   Pulse 66   Temp 97.7 F (36.5 C) (Other (Comment))   Ht 5' 0.5" (1.537 m)   Wt 144 lb 8 oz (65.5 kg)   LMP 09/20/2016 (Exact Date)   SpO2 97%   BMI 27.76 kg/m    Subjective:    Patient ID: Judy Mcdaniel, female    DOB: 1971/02/13, 46 y.o.   MRN: [id number]  HPI: Judy Mcdaniel is a 46 y.o. female presenting on 09/22/2016 for Anemia   HPI   Pt has not yet turned in [employer] discount application.  This has been over a year that she has not done this.   She has Constipation at times- none presently  She says that Lately she feels tired    Relevant past medical, surgical, family and social history reviewed and updated as indicated. Interim medical history since our last visit reviewed. Allergies and medications reviewed and updated.   Current Outpatient Prescriptions:  .  ferrous sulfate 325 (65 FE) MG tablet, Take 650 mg by mouth daily with breakfast., Disp: , Rfl:    Review of Systems  Constitutional: Positive for fatigue. Negative for appetite change, chills, diaphoresis, fever and unexpected weight change.  HENT: Positive for congestion and sneezing. Negative for dental problem, drooling, ear pain, facial swelling, hearing loss, mouth sores, sore throat, trouble swallowing and voice change.   Eyes: Positive for redness and itching. Negative for pain, discharge and visual disturbance.  Respiratory: Negative for cough, choking, shortness of breath and wheezing.   Cardiovascular: Negative for chest pain, palpitations and leg swelling.  Gastrointestinal: Positive for constipation. Negative for abdominal pain, blood in stool, diarrhea and vomiting.  Endocrine: Negative for cold intolerance, heat intolerance and polydipsia.  Genitourinary: Negative for decreased urine volume, dysuria and hematuria.  Musculoskeletal: Positive for back pain and gait problem. Negative for arthralgias.  Skin:  Negative for rash.  Allergic/Immunologic: Negative for environmental allergies.  Neurological: Positive for headaches. Negative for seizures, syncope and light-headedness.  Hematological: Negative for adenopathy.  Psychiatric/Behavioral: Negative for agitation, dysphoric mood and suicidal ideas. The patient is not nervous/anxious.     Per HPI unless specifically indicated above     Objective:    BP 122/78 (BP Location: Left Arm, Patient Position: Sitting, Cuff Size: Normal)   Pulse 66   Temp 97.7 F (36.5 C) (Other (Comment))   Ht 5' 0.5" (1.537 m)   Wt 144 lb 8 oz (65.5 kg)   LMP 09/20/2016 (Exact Date)   SpO2 97%   BMI 27.76 kg/m   Wt Readings from Last 3 Encounters:  09/22/16 144 lb 8 oz (65.5 kg)  06/30/16 142 lb 4 oz (64.5 kg)  03/05/16 147 lb (66.7 kg)    Physical Exam  Constitutional: She is oriented to person, place, and time. She appears well-developed and well-nourished.  HENT:  Head: Normocephalic and atraumatic.  Neck: Neck supple.  Cardiovascular: Normal rate and regular rhythm.   Pulmonary/Chest: Effort normal and breath sounds normal.  Abdominal: Soft. Bowel sounds are normal. She exhibits no mass. There is no hepatosplenomegaly. There is no tenderness.  Musculoskeletal: She exhibits no edema.  Lymphadenopathy:    She has no cervical adenopathy.  Neurological: She is alert and oriented to person, place, and time.  Skin: Skin is warm and dry.  Psychiatric: She has a normal mood and affect. Her behavior is normal.  Vitals reviewed.       Assessment & Plan:  Encounter Diagnoses  Name Primary?  Marland Kitchen Anemia, unspecified type Yes  . Constipation, unspecified constipation type   . Menorrhagia with irregular cycle   . Personal history of noncompliance with medical treatment, presenting hazards to health   . Screening cholesterol level       -check h/h today. Will call with results -counseled pt and recommended Water and fiber for constipation -urged  pt to Turn in [employer] discount application -re-Refer to gyn -f/u 3 months.  RTO sooner prn  check lipids before appt

## 2016-09-25 ENCOUNTER — Ambulatory Visit: Payer: Self-pay | Admitting: Physician Assistant

## 2016-11-25 ENCOUNTER — Other Ambulatory Visit: Payer: Self-pay | Admitting: Physician Assistant

## 2016-11-25 DIAGNOSIS — D649 Anemia, unspecified: Secondary | ICD-10-CM

## 2016-11-25 DIAGNOSIS — Z13220 Encounter for screening for lipoid disorders: Secondary | ICD-10-CM

## 2016-12-22 ENCOUNTER — Ambulatory Visit: Payer: Self-pay | Admitting: Physician Assistant

## 2017-01-02 ENCOUNTER — Other Ambulatory Visit (HOSPITAL_COMMUNITY)
Admission: RE | Admit: 2017-01-02 | Discharge: 2017-01-02 | Disposition: A | Discharge status: Home / Self Care | Payer: Self-pay | Source: Ambulatory Visit | Attending: Physician Assistant | Admitting: Physician Assistant

## 2017-01-02 DIAGNOSIS — Z13220 Encounter for screening for lipoid disorders: Secondary | ICD-10-CM | POA: Insufficient documentation

## 2017-01-02 DIAGNOSIS — D649 Anemia, unspecified: Secondary | ICD-10-CM | POA: Insufficient documentation

## 2017-01-02 LAB — COMPREHENSIVE METABOLIC PANEL
ALBUMIN: 4.1 g/dL (ref 3.5–5.0)
ALT: 21 U/L (ref 14–54)
AST: 25 U/L (ref 15–41)
Alkaline Phosphatase: 71 U/L (ref 38–126)
Anion gap: 6 (ref 5–15)
BUN: 12 mg/dL (ref 6–20)
CHLORIDE: 101 mmol/L (ref 101–111)
CO2: 28 mmol/L (ref 22–32)
CREATININE: 0.6 mg/dL (ref 0.44–1.00)
Calcium: 8.8 mg/dL — ABNORMAL LOW (ref 8.9–10.3)
GFR calc Af Amer: >60 mL/min (ref >60)
GLUCOSE: 105 mg/dL — AB (ref 65–99)
POTASSIUM: 4.4 mmol/L (ref 3.5–5.1)
SODIUM: 135 mmol/L (ref 135–145)
Total Bilirubin: 1.1 mg/dL (ref 0.3–1.2)
Total Protein: 7.2 g/dL (ref 6.5–8.1)

## 2017-01-02 LAB — CBC
HEMATOCRIT: 45 % (ref 36.0–46.0)
HEMOGLOBIN: 14.2 g/dL (ref 12.0–15.0)
MCH: 27.6 pg (ref 26.0–34.0)
MCHC: 31.6 g/dL (ref 30.0–36.0)
MCV: 87.5 fL (ref 78.0–100.0)
Platelets: 318 10*3/uL (ref 150–400)
RBC: 5.14 MIL/uL — ABNORMAL HIGH (ref 3.87–5.11)
RDW: 14 % (ref 11.5–15.5)
WBC: 5.7 10*3/uL (ref 4.0–10.5)

## 2017-01-02 LAB — LIPID PANEL
Cholesterol: 225 mg/dL — ABNORMAL HIGH (ref 0–200)
HDL: 63 mg/dL (ref >40)
LDL CALC: 128 mg/dL — AB (ref 0–99)
TRIGLYCERIDES: 169 mg/dL — AB (ref <150)
Total CHOL/HDL Ratio: 3.6 RATIO
VLDL: 34 mg/dL (ref 0–40)

## 2017-01-07 ENCOUNTER — Ambulatory Visit: Payer: Self-pay | Admitting: Physician Assistant

## 2017-01-07 ENCOUNTER — Encounter: Payer: Self-pay | Admitting: Physician Assistant

## 2017-01-07 VITALS — BP 106/76 | HR 61 | Temp 97.9°F | Ht 60.5 in | Wt 147.0 lb

## 2017-01-07 DIAGNOSIS — E785 Hyperlipidemia, unspecified: Secondary | ICD-10-CM

## 2017-01-07 DIAGNOSIS — Z0000 Encounter for general adult medical examination without abnormal findings: Secondary | ICD-10-CM

## 2017-01-07 DIAGNOSIS — N926 Irregular menstruation, unspecified: Secondary | ICD-10-CM

## 2017-01-07 DIAGNOSIS — Z1211 Encounter for screening for malignant neoplasm of colon: Secondary | ICD-10-CM

## 2017-01-07 DIAGNOSIS — K5900 Constipation, unspecified: Secondary | ICD-10-CM

## 2017-01-07 DIAGNOSIS — D649 Anemia, unspecified: Secondary | ICD-10-CM

## 2017-01-07 DIAGNOSIS — Z1239 Encounter for other screening for malignant neoplasm of breast: Secondary | ICD-10-CM

## 2017-01-07 NOTE — Progress Notes (Signed)
BP 106/76 (BP Location: Left Arm, Patient Position: Sitting, Cuff Size: Normal)   Pulse 61   Temp 97.9 F (36.6 C) (Other (Comment))   Ht 5' 0.5" (1.537 m)   Wt 147 lb (66.7 kg)   LMP 12/24/2016 (Approximate)   SpO2 97%   BMI 28.24 kg/m    Subjective:    Patient ID: Judy Mcdaniel, female    DOB: 12/19/1970, 46 y.o.   MRN: [id number]  HPI: Judy Mcdaniel is a 46 y.o. female presenting on 01/07/2017 for Anemia   HPI  Pt has still not turned in her [employer] discount application.  Pt referred to gyn for menorrhagia.  She says her menses are not heavy for the past 2-3 months.  She says it is light and she thinks she is near menopause.   Her constipation is improved.     Relevant past medical, surgical, family and social history reviewed and updated as indicated. Interim medical history since our last visit reviewed. Allergies and medications reviewed and updated.   Current Outpatient Prescriptions:  .  ferrous sulfate 325 (65 FE) MG tablet, Take 650 mg by mouth daily with breakfast., Disp: , Rfl:    Review of Systems  Constitutional: Negative for appetite change, chills, diaphoresis, fatigue, fever and unexpected weight change.  HENT: Positive for dental problem. Negative for congestion, drooling, ear pain, facial swelling, hearing loss, mouth sores, sneezing, sore throat, trouble swallowing and voice change.   Eyes: Positive for redness and itching. Negative for pain, discharge and visual disturbance.  Respiratory: Negative for cough, choking, shortness of breath and wheezing.   Cardiovascular: Negative for chest pain, palpitations and leg swelling.  Gastrointestinal: Positive for constipation. Negative for abdominal pain, blood in stool, diarrhea and vomiting.  Endocrine: Negative for cold intolerance, heat intolerance and polydipsia.  Genitourinary: Negative for decreased urine volume, dysuria and hematuria.  Musculoskeletal: Positive for back pain and gait problem.  Negative for arthralgias.  Skin: Negative for rash.  Allergic/Immunologic: Positive for environmental allergies.  Neurological: Positive for headaches. Negative for seizures, syncope and light-headedness.  Hematological: Negative for adenopathy.  Psychiatric/Behavioral: Negative for agitation, dysphoric mood and suicidal ideas. The patient is not nervous/anxious.     Per HPI unless specifically indicated above     Objective:    BP 106/76 (BP Location: Left Arm, Patient Position: Sitting, Cuff Size: Normal)   Pulse 61   Temp 97.9 F (36.6 C) (Other (Comment))   Ht 5' 0.5" (1.537 m)   Wt 147 lb (66.7 kg)   LMP 12/24/2016 (Approximate)   SpO2 97%   BMI 28.24 kg/m   Wt Readings from Last 3 Encounters:  01/07/17 147 lb (66.7 kg)  09/22/16 144 lb 8 oz (65.5 kg)  06/30/16 142 lb 4 oz (64.5 kg)    Physical Exam  Constitutional: She is oriented to person, place, and time. She appears well-developed and well-nourished.  HENT:  Head: Normocephalic and atraumatic.  Neck: Neck supple.  Cardiovascular: Normal rate and regular rhythm.   Pulmonary/Chest: Effort normal and breath sounds normal.  Abdominal: Soft. Bowel sounds are normal. She exhibits no mass. There is no hepatosplenomegaly. There is no tenderness.  Musculoskeletal: She exhibits no edema.  Lymphadenopathy:    She has no cervical adenopathy.  Neurological: She is alert and oriented to person, place, and time.  Skin: Skin is warm and dry.  Psychiatric: She has a normal mood and affect. Her behavior is normal.  Vitals reviewed.   Results for orders placed or performed  during the hospital encounter of 01/02/17  CBC  Result Value Ref Range   WBC 5.7 4.0 - 10.5 K/uL   RBC 5.14 (H) 3.87 - 5.11 MIL/uL   Hemoglobin 14.2 12.0 - 15.0 g/dL   HCT 45.0 36.0 - 46.0 %   MCV 87.5 78.0 - 100.0 fL   MCH 27.6 26.0 - 34.0 pg   MCHC 31.6 30.0 - 36.0 g/dL   RDW 14.0 11.5 - 15.5 %   Platelets 318 150 - 400 K/uL  Lipid Profile  Result  Value Ref Range   Cholesterol 225 (H) 0 - 200 mg/dL   Triglycerides 169 (H) <150 mg/dL   HDL 63 >40 mg/dL   Total CHOL/HDL Ratio 3.6 RATIO   VLDL 34 0 - 40 mg/dL   LDL Cholesterol 128 (H) 0 - 99 mg/dL  Comprehensive metabolic panel  Result Value Ref Range   Sodium 135 135 - 145 mmol/L   Potassium 4.4 3.5 - 5.1 mmol/L   Chloride 101 101 - 111 mmol/L   CO2 28 22 - 32 mmol/L   Glucose, Bld 105 (H) 65 - 99 mg/dL   BUN 12 6 - 20 mg/dL   Creatinine, Ser 0.60 0.44 - 1.00 mg/dL   Calcium 8.8 (L) 8.9 - 10.3 mg/dL   Total Protein 7.2 6.5 - 8.1 g/dL   Albumin 4.1 3.5 - 5.0 g/dL   AST 25 15 - 41 U/L   ALT 21 14 - 54 U/L   Alkaline Phosphatase 71 38 - 126 U/L   Total Bilirubin 1.1 0.3 - 1.2 mg/dL   GFR calc non Af Amer >60 >60 mL/min   GFR calc Af Amer >60 >60 mL/min   Anion gap 6 5 - 15      Assessment & Plan:   Encounter Diagnoses  Name Primary?  . Well adult exam Yes  . Screening for breast cancer   . Hyperlipidemia, unspecified hyperlipidemia type   . Screening for colon cancer   . Abnormal menses   . Anemia, unspecified type   . Constipation, unspecified constipation type     -reviewed labs with pt.  -counseled pt on Lowfat diet and regular exercise to help lipids.  She was given a handout as well -she will be Scheduled for screening mammogram -pt was given iFOBT for colon cancer screening -pt to f/u 6 months.  RTO sooner prn

## 2017-01-07 NOTE — Patient Instructions (Signed)
Dieta restringida en grasas y colesterol (Fat and Cholesterol Restricted Diet) Los niveles altos de grasa y colesterol en la sangre pueden causar varios problemas de salud, como enfermedades del corazn, de los vasos sanguneos, de la vescula, del hgado y del pncreas. Las grasas son fuentes de energa concentrada que existen en varias formas. Consumir en exceso ciertos tipos de grasa, incluidas las grasas saturadas, puede ser perjudicial. El colesterol es una sustancia que el organismo necesita en pequeas cantidades. El cuerpo fabrica todo el colesterol que necesita. El exceso de colesterol proviene de los alimentos que come. Si sus niveles de colesterol y grasas saturadas en la sangre son elevados, puede tener problemas de salud, dado que el exceso de grasa y colesterol se acumula en las paredes de los vasos sanguneos, provocando su estrechamiento. Elegir los alimentos apropiados le permitir controlar su ingesta de grasa y colesterol. Esto le ayudar a mantener los niveles de estas sustancias en la sangre dentro de los lmites normales y a reducir el riesgo de contraer enfermedades. EN QU CONSISTE EL PLAN? El mdico le recomienda que:  Limite la ingesta de grasas a alrededor del _______% del total de caloras por da.  Limite la cantidad de colesterol en su dieta a menos de _________mg por da.  Consuma entre 20 y 30gramos de fibra todos los das. QU TIPOS DE GRASAS DEBO ELEGIR?  Elija grasas saludables con mayor frecuencia. Elija las grasas monoinsaturadas y poliinsaturadas, como el aceite de oliva y canola, las semillas de lino, las nueces, las almendras y las semillas.  Consuma ms grasas omega-3. Las mejores opciones incluyen salmn, caballa, sardinas, atn, aceite de lino y semillas de lino molidas. Trate de consumir pescado al menos dos veces por semana.  Limite el consumo de grasas saturadas. Estas se encuentran principalmente en los productos de origen animal, como las carnes,  la mantequilla y la crema. Las grasas saturadas de origen vegetal incluyen aceite de palma, de palmiste y de coco.  Evite los alimentos con aceites parcialmente hidrogenados. Estos contienen grasas trans. Entre los ejemplos de alimentos con grasas trans se incluyen margarinas en barra, algunas margarinas untables, galletas dulces o saladas y otros productos horneados.  QU PAUTAS GENERALES DEBO SEGUIR? Estas pautas para una alimentacin saludable le ayudarn a controlar su ingesta de grasa y colesterol:  Lea las etiquetas de los alimentos detenidamente para identificar los que contienen grasas trans o altas cantidades de grasas saturadas.  Llene la mitad del plato con verduras y ensaladas de hojas verdes.  Llene un cuarto del plato con cereales integrales. Busque la palabra "integral" en el primer lugar de la lista de ingredientes.  Llene un cuarto del plato con alimentos con protenas magras.  Limite las frutas a dos porciones por da. Elija frutas en lugar de jugos.  Coma ms alimentos que contienen fibra, como manzanas, brcoli, zanahorias, frijoles, guisantes y cebada.  Consuma ms comida casera y menos de restaurante, de buf y comida rpida.  Limite o evite el alcohol.  Limite los alimentos con alto contenido de almidn y azcar.  Limite el consumo de alimentos fritos.  Cocine los alimentos utilizando mtodos que no sean la fritura. Las opciones de coccin ms adecuadas son hornear, hervir, grillar y asar a la parrilla.  Baje de peso si es necesario. Perder solo del 5 al 10% de su peso inicial puede ayudarle a mejorar su estado de salud general y a prevenir enfermedades, como la diabetes y las enfermedades cardacas. QU ALIMENTOS PUEDO COMER? Cereales Cereales integrales,   como los panes de salvado o integrales, las galletas, los cereales y las pastas. Avena sin endulzar, trigo, cebada, quinua o arroz integral. Tortillas de harina de maz o de salvado. Verduras Verduras  frescas o congeladas (crudas, al vapor, asadas o grilladas). Ensaladas de hojas verdes. Frutas Frutas frescas, en conserva (en su jugo natural) o frutas congeladas. Carnes y otros productos con protenas Carne de res molida (al 85% o ms magra), carne de res de animales alimentados con pastos o carne de res sin la grasa. Pollo o pavo sin piel. Carne de pollo o de pavo molida. Cerdo sin la grasa. Todos los pescados y frutos de mar. Huevos. Porotos, guisantes o lentejas secos. Frutos secos o semillas sin sal. Frijoles secos o en lata sin sal. Lcteos Productos lcteos con bajo contenido de grasas, como leche descremada o al 1%, quesos reducidos en grasas o al 2%, ricota con bajo contenido de grasas o queso cottage, o yogur natural con bajo contenido de grasas. Grasas y aceites Margarinas untables que no contengan grasas trans. [place] y condimentos para ensaladas livianos o reducidos en grasas. [place]. Aceites de oliva, canola, ssamo o crtamo. Mantequilla natural de cacahuate o almendra (elija la que no tenga agregado de aceite o azcar). Los artculos mencionados arriba pueden no ser una lista completa de las bebidas o los alimentos recomendados. Comunquese con el nutricionista para conocer ms opciones. QU ALIMENTOS NO SE RECOMIENDAN? Cereales Pan blanco. Pastas blancas. Arroz blanco. Pan de maz. Bagels, pasteles y croissants. Galletas saladas que contengan grasas trans. Verduras Papas blancas. Maz. Verduras con crema o fritas. Verduras en salsa de queso. Frutas Frutas secas. Fruta enlatada en almbar liviano o espeso. Jugo de frutas. Carnes y otros productos con protenas Cortes de carne con grasa. Costillas, alas de pollo, tocineta, salchicha, mortadela, salame, chinchulines, tocino, perros calientes, salchichas alemanas y embutidos envasados. Hgado y otros rganos. Lcteos Leche entera o al 2%, crema, mezcla de leche y crema, y queso crema. Quesos enteros. Yogur entero o  endulzado. Quesos con toda su grasa. Cremas no lcteas y coberturas batidas. Quesos procesados, quesos para untar o cuajadas. Dulces y postres Jarabe de maz, azcares, miel y melazas. Caramelos. Mermelada y jalea. Jarabe. Cereales endulzados. Galletas, pasteles, bizcochuelos, donas, muffins y helado. Grasas y aceites Mantequilla, margarina en barra, manteca de cerdo, grasa, mantequilla clarificada o grasa de tocino. Aceites de coco, de palmiste o de palma. Bebidas Alcohol. Bebidas endulzadas (como refrescos, limonadas y bebidas frutales o ponches). Los artculos mencionados arriba pueden no ser una lista completa de las bebidas y los alimentos que se deben evitar. Comunquese con el nutricionista para obtener ms informacin. Esta informacin no tiene como fin reemplazar el consejo del mdico. Asegrese de hacerle al mdico cualquier pregunta que tenga. Document Released: 06/02/2005 Document Revised: 06/23/2014 Document Reviewed: 08/31/2013 Elsevier Interactive Patient Education  2017 [employer]  

## 2017-01-20 ENCOUNTER — Encounter: Payer: Self-pay | Admitting: *Deleted

## 2017-02-19 ENCOUNTER — Encounter (INDEPENDENT_AMBULATORY_CARE_PROVIDER_SITE_OTHER): Payer: Self-pay

## 2017-02-19 ENCOUNTER — Encounter: Payer: Self-pay | Admitting: Adult Health

## 2017-02-19 ENCOUNTER — Ambulatory Visit (INDEPENDENT_AMBULATORY_CARE_PROVIDER_SITE_OTHER): Payer: Self-pay | Admitting: Adult Health

## 2017-02-19 VITALS — BP 120/78 | HR 78 | Ht 60.05 in | Wt 146.0 lb

## 2017-02-19 DIAGNOSIS — N920 Excessive and frequent menstruation with regular cycle: Secondary | ICD-10-CM | POA: Insufficient documentation

## 2017-02-19 DIAGNOSIS — N941 Unspecified dyspareunia: Secondary | ICD-10-CM

## 2017-02-19 DIAGNOSIS — Z862 Personal history of diseases of the blood and blood-forming organs and certain disorders involving the immune mechanism: Secondary | ICD-10-CM | POA: Insufficient documentation

## 2017-02-19 NOTE — Patient Instructions (Signed)
Menorragia (Menorrhagia) Se llama menorragia a los perodos menstruales abundantes o que duran ms de lo habitual. En la menorragia, la prdida de sangre y los clicos en cada perodo pueden hacerle imposible seguir con sus actividades habituales. CAUSAS En algunos casos, la causa de los perodos abundantes es desconocida, pero hay algunas afecciones que pueden causar Psychologist, educational. Las causas ms frecuentes son:  Un problema con la tiroides, que es la glndula productora de hormonas (hipotiroidismo).  Formaciones no cancerosas en el tero (plipos o fibromas).  Un desequilibrio entre las hormonas estrgeno y Immunologist.  Uno de sus ovarios no libera vulos durante uno o ms meses.  Efectos secundarios por haberse colocado un dispositivo intrauterino (DIU).  Efectos secundarios por algunos medicamentos, como antiinflamatorios o anticoagulantes.  Trastornos hemorrgicos que impiden la Transport planner. SIGNOS Y SNTOMAS Durante un perodo normal, el sangrado dura entre 4 y 16 das. Los signos de que el perodo es muy abundante son:  [place] rutinaria tiene que cambiar el apsito o el tampn cada 1 o 2 horas debido a que est completamente empapado.  Elimina cogulos ms grandes de 1 pulgada (2,5 cm).  Tiene sangrado durante ms de 7 das.  Necesita usar apsitos y tampones al mismo tiempo porque pierde [employer].  Debe levantarse para cambiarse el apsito o el tampn durante la noche.  Tiene sntomas de anemia como cansancio, fatiga o falta de aire. DIAGNSTICO [employer] har un examen fsico y le har preguntas sobre sus sntomas y su historia menstrual. Podr indicarle otros estudios segn lo que encuentre Social worker. Estos estudios pueden ser:  Anlisis de [place]. Los [employer] de sangre se usan para verificar si est embarazada o tiene cambios hormonales, un trastorno tiroideo o de sangrado, niveles bajos de hierro (anemia) u otros problemas.  Biopsia de  endometrio. El mdico tomar [place] de tejido del interior del tero para que sea examinado con un microscopio.  Ecografa plvica. Este estudio [place] ondas de sonido para tomar imgenes del tero, los ovarios y Geneticist, molecular. Las imgenes pueden mostrar si tiene fibromas u otros crecimientos.  Histeroscopa. Para este estudio, el mdico usar un pequeo telescopio para Chemical engineer interior del tero. Segn los [employer] estudios iniciales, el mdico podr indicar ms [employer]. TRATAMIENTO Puede ser que no sea necesario un tratamiento mdico. Si lo necesita, el mdico primero podr recomendarle un tratamiento con uno o ms medicamentos. Si no se reduce el sangrado lo suficiente, el tratamiento quirrgico podra ser [employer]. El mejor tratamiento para usted depender de:  Si necesita evitar un embarazo.  Si desea tener hijos en el futuro.  La causa y la gravedad del sangrado.  Su opinin o preferencia personal. Algunos medicamentos para la menorragia son:  Mtodos anticonceptivos que contengan hormonas. Estos incluyen la pldora anticonceptiva, el parche en la piel, el anillo vaginal, las inyecciones que se aplican cada 3 meses, el DIU hormonal y el implante. Estos tratamientos reducen el sangrado durante el perodo menstrual.  Medicamentos que espesan la sangre y hacen ms lento el sangrado.  Medicamentos que reducen la inflamacin, como el ibuprofeno.  Medicamentos que contienen una hormona sinttica llamada progestina.  Medicamentos que [employer] ovarios dejen de funcionar durante un breve lapso. Podra ser necesario un tratamiento quirrgico para la menorragia si los medicamentos no son eficaces. Las opciones de tratamiento incluyen:  Dilatacin y curetaje (D y C). En este procedimiento, el mdico abre (dilata) el cuello del tero y luego raspa o succiona  tejido del revestimiento interior del tero para reducir el sangrado menstrual.  Histeroscopa quirrgica. En este  procedimiento, se utiliza un pequeo tubo con Hali Marry (histeroscopio) para observar la cavidad uterina y ayudar en la extirpacin quirrgica de un plipo que puede ser la causa de perodos abundantes.  Ablacin del endometrio. Por medio de [employer], el mdico destruye de [place] todo el revestimiento interno del tero (endometrio). Luego de la ablacin del endometrio, la mayora de las mujeres tienen escaso flujo menstrual, o no lo tienen. La ablacin del endometrio reduce la posibilidad de quedar embarazada.  Reseccin del endometrio. En este procedimiento quirrgico, se [place] un asa de Environmental manager para extirpar el revestimiento interno del tero. Este procedimiento tambin reduce la posibilidad de [place].  Histerectoma. La remocin [place] del tero y el cuello del tero es un procedimiento permanente que detiene los perodos [place]. El embarazo no es posible luego de Physicist, medical. Este procedimiento requiere de anestesia y hospitalizacin. [place] solo medicamentos de venta libre o recetados, segn las indicaciones del [place] todos los medicamentos recetados exactamente como se le indic. No cambie ni reemplace los medicamentos sin consultarlo con el mdico.  Tome los comprimidos de hierro recetados, Scientist, water quality segn las indicaciones del mdico. Las hemorragias de larga duracin pueden traer como consecuencia una disminucin en los niveles de hierro. Los comprimidos de hierro ayudan a Camera operator hierro que el organismo pierde luego de un sangrado abundante. El hierro puede causarle estreimiento. Si esto es un problema, aumente el consumo de [place], frutas y [place].  No tome aspirina ni medicamentos que contengan aspirina desde 1 semana antes ni durante el perodo menstrual. La aspirina puede hacer que la hemorragia empeore.  Si necesita cambiar el apsito o el tampn ms de una vez cada  2horas, Nature conservation officer en cama y descanse todo lo posible hasta que la hemorragia se detenga.  Siga una dieta balanceada. Consuma alimentos ricos en hierro. Por ejemplo, vegetales de [employer], carne, hgado, huevos y panes y Actor de grano entero. No trate de perder peso hasta que la hemorragia anormal se detenga y los niveles de hierro en la sangre vuelvan a la normalidad.  SOLICITE ATENCIN MDICA SI:  Empapa un tampn o un apsito cada 1 o 2 horas, y [employer] ocurre cada vez que tiene el perodo.  Necesita usar apsitos y tampones al mismo tiempo porque pierde [employer].  Debe cambiarse el apsito o el tampn durante la noche.  Tiene un perodo que dura ms de 8 das.  Elimina cogulos de ms de 1 pulgada (2,5 cm).  Tiene perodos irregulares que ocurren ms o menos de una vez al mes.  Se siente mareada o se desmaya.  Se siente muy dbil o cansada.  Le falta el aire o siente que el corazn late muy rpido al hacer [place].  Tiene nuseas y vmitos o diarrea mientras toma los medicamentos.  Tiene algn problema que puede estar relacionado con el medicamento que est tomando.  SOLICITE ATENCIN MDICA DE INMEDIATO SI:  Empapa 4 o ms apsitos o tampones en 2 horas.  Tiene sangrado y est embarazada.  ASEGRESE DE QUE:  Comprende estas instrucciones.  Controlar su afeccin.  Recibir ayuda de inmediato si no mejora o si empeora.  Esta informacin no tiene Marine scientist el consejo del mdico. Asegrese de hacerle al mdico cualquier pregunta que tenga. Document Released: 03/12/2005 Document Revised: 09/24/2015 Document Reviewed: 11/21/2012 Elsevier Interactive  Patient Education  2017 [employer]. See me in 3 weeks [place] 9/20 at [facility] at 3:30

## 2017-02-19 NOTE — Progress Notes (Signed)
Subjective:     Patient ID: Judy Mcdaniel, female   DOB: 1970-11-02, 46 y.o.   MRN: [id number]  HPI Judy Mcdaniel is a 46 year old Hispanic female in with interrupter complaining of heavy periods.Has had anemia in past, but taking iron now. Had pap at [facility].  Review of Systems +heavy periods +frequenct urination at night, up about 4 times +occasional constipation +pain with sex at times  Reviewed past medical,surgical, social and family history. Reviewed medications and allergies.     Objective:   Physical Exam BP 120/78 (BP Location: Left Arm, Patient Position: Sitting, Cuff Size: Normal)   Pulse 78   Ht 5' 0.05" (1.525 m)   Wt 146 lb (66.2 kg)   LMP 02/04/2017   BMI 28.47 kg/m  Skin warm and dry. Neck: mid line trachea, normal thyroid, good ROM, no lymphadenopathy noted. Lungs: clear to ausculation bilaterally. Cardiovascular: regular rate and rhythm. Pelvic: external genitalia is normal in appearance no lesions, vagina: good color, moisture  and rugae,urethra has no lesions or masses noted, cervix: bulbous, uterus: normal size, shape and contour, mildly tender, no masses felt, adnexa: no masses or tenderness noted. Bladder is non tender and no masses felt.    Left hip has some tenderness to palpation.(F/U with Larene Beach at [facility]) PHQ 2 score 0.  Assessment:     1. Menorrhagia with regular cycle   2. History of anemia   3. Dyspareunia in female       Plan:     Pelvic US at [facility] 9/20 at 3:30 pm Follow up with me in 3 weeks, will review [place] and options to stop periods, and address frequent urination.  Review handout on menorrhagia Apply for [employer], papers given

## 2017-03-05 ENCOUNTER — Ambulatory Visit (HOSPITAL_COMMUNITY)
Admission: RE | Admit: 2017-03-05 | Discharge: 2017-03-05 | Disposition: A | Discharge status: Home / Self Care | Payer: Self-pay | Source: Ambulatory Visit | Attending: Adult Health | Admitting: Adult Health

## 2017-03-05 DIAGNOSIS — N920 Excessive and frequent menstruation with regular cycle: Secondary | ICD-10-CM

## 2017-03-05 DIAGNOSIS — Z862 Personal history of diseases of the blood and blood-forming organs and certain disorders involving the immune mechanism: Secondary | ICD-10-CM

## 2017-03-05 DIAGNOSIS — D259 Leiomyoma of uterus, unspecified: Secondary | ICD-10-CM | POA: Insufficient documentation

## 2017-03-05 DIAGNOSIS — N941 Unspecified dyspareunia: Secondary | ICD-10-CM

## 2017-03-10 ENCOUNTER — Encounter: Payer: Self-pay | Admitting: Adult Health

## 2017-03-10 DIAGNOSIS — D219 Benign neoplasm of connective and other soft tissue, unspecified: Secondary | ICD-10-CM

## 2017-03-10 HISTORY — DX: Benign neoplasm of connective and other soft tissue, unspecified: D21.9

## 2017-03-12 ENCOUNTER — Encounter: Payer: Self-pay | Admitting: Adult Health

## 2017-03-12 ENCOUNTER — Ambulatory Visit (INDEPENDENT_AMBULATORY_CARE_PROVIDER_SITE_OTHER): Payer: Self-pay | Admitting: Adult Health

## 2017-03-12 VITALS — BP 130/80 | HR 67 | Ht 60.0 in | Wt 145.0 lb

## 2017-03-12 DIAGNOSIS — N920 Excessive and frequent menstruation with regular cycle: Secondary | ICD-10-CM

## 2017-03-12 DIAGNOSIS — D259 Leiomyoma of uterus, unspecified: Secondary | ICD-10-CM

## 2017-03-12 DIAGNOSIS — Z862 Personal history of diseases of the blood and blood-forming organs and certain disorders involving the immune mechanism: Secondary | ICD-10-CM

## 2017-03-12 MED ORDER — MEGESTROL ACETATE 40 MG PO TABS: ORAL_TABLET | ORAL | 3 refills | Status: DC

## 2017-03-12 NOTE — Progress Notes (Signed)
Subjective:     Patient ID: Judy Mcdaniel, female   DOB: Aug 13, 1970, 46 y.o.   MRN: [id number]  HPI Judy Mcdaniel is a 46 year old Hispanic female in to discuss [place] results and heavy periods.   Review of Systems  +Heavy periods  Reviewed past medical,surgical, social and family history. Reviewed medications and allergies.     Objective:   Physical Exam BP 130/80 (BP Location: Left Arm, Patient Position: Sitting, Cuff Size: Normal)   Pulse 67   Ht 5' (1.524 m)   Wt 145 lb (65.8 kg)   LMP 02/28/2017 (Approximate)   BMI 28.32 kg/m   Reviewed [place] with pt and had interrupter Ms [place].  Measurements: 7.9 x 4.4 x 6.3 cm. Multiple fibroids. The largest fibroid in the fundus to the left measures 3.1 x 3.1 x 3.4 cm. A smaller fibroid centrally measures 2.1 x 1.8 x 1.8 cm. Nabothian cysts are seen in the cervix.  Endometrium  Thickness: 4 mm.  No focal abnormality visualized.  Right ovary  Measurements: 2.1 x 1.2 x 1.8 cm. Normal appearance/no adnexal mass.  Left ovary  Not visualized.  Other findings  No abnormal free fluid. Discussed that fibroids not cancerous and can be cause of heavy periods, will prescribe Megace to stop periods. And Follow up in 8 weeks.    Assessment:     1. Uterine leiomyoma, unspecified location   2. Menorrhagia with regular cycle   3. History of anemia       Plan:     Meds ordered this encounter  Medications  . megestrol (MEGACE) 40 MG tablet    Sig: Take 2 daily    Dispense:  60 tablet    Refill:  3    Order Specific Question:   Supervising Provider    Answer:   Florian Buff [[id number]]  Review handout on fibroids   F/U in 8 weeks

## 2017-03-12 NOTE — Patient Instructions (Signed)
Uterine Fibroids Uterine fibroids are tissue masses (tumors) that can develop in the womb (uterus). They are also called leiomyomas. This type of tumor is not cancerous (benign) and does not spread to other parts of the body outside of the pelvic area, which is between the hip bones. Occasionally, fibroids may develop in the fallopian tubes, in the cervix, or on the support structures (ligaments) that surround the uterus. You can have one or many fibroids. Fibroids can vary in size, weight, and where they grow in the uterus. Some can become quite large. Most fibroids do not require medical treatment. What are the causes? A fibroid can develop when a single uterine cell keeps growing (replicating). Most cells in the human body have a control mechanism that keeps them from replicating without control. What are the signs or symptoms? Symptoms may include:  Heavy bleeding during your period.  Bleeding or spotting between periods.  Pelvic pain and pressure.  Bladder problems, such as needing to urinate more often (urinary frequency) or urgently.  Inability to reproduce offspring (infertility).  Miscarriages.  How is this diagnosed? Uterine fibroids are diagnosed through a physical exam. Your health care provider may feel the lumpy tumors during a pelvic exam. Ultrasonography and an MRI may be done to determine the size, location, and number of fibroids. How is this treated? Treatment may include:  Watchful waiting. This involves getting the fibroid checked by your health care provider to see if it grows or shrinks. Follow your health care provider's recommendations for how often to have this checked.  Hormone medicines. These can be taken by mouth or given through an intrauterine device (IUD).  Surgery. ? Removing the fibroids (myomectomy) or the uterus (hysterectomy). ? Removing blood supply to the fibroids (uterine artery embolization).  If fibroids interfere with your fertility and you  want to become pregnant, your health care provider may recommend having the fibroids removed. Follow these instructions at home:  Keep all follow-up visits as directed by your health care provider. This is important.  Take over-the-counter and prescription medicines only as told by your health care provider. ? If you were prescribed a hormone treatment, take the hormone medicines exactly as directed.  Ask your health care provider about taking iron pills and increasing the amount of dark green, leafy vegetables in your diet. These actions can help to boost your blood iron levels, which may be affected by heavy menstrual bleeding.  Pay close attention to your period and tell your health care provider about any changes, such as: ? Increased blood flow that requires you to use more pads or tampons than usual per month. ? A change in the number of days that your period lasts per month. ? A change in symptoms that are associated with your period, such as abdominal cramping or back pain. Contact a health care provider if:  You have pelvic pain, back pain, or abdominal cramps that cannot be controlled with medicines.  You have an increase in bleeding between and during periods.  You soak tampons or pads in a half hour or less.  You feel lightheaded, extra tired, or weak. Get help right away if:  You faint.  You have a sudden increase in pelvic pain. This information is not intended to replace advice given to you by your health care provider. Make sure you discuss any questions you have with your health care provider. Document Released: 05/30/2000 Document Revised: 01/31/2016 Document Reviewed: 11/29/2013 Elsevier Interactive Patient Education  2018 Elsevier Inc.  

## 2017-05-06 ENCOUNTER — Encounter: Payer: Self-pay | Admitting: Adult Health

## 2017-05-06 ENCOUNTER — Ambulatory Visit (INDEPENDENT_AMBULATORY_CARE_PROVIDER_SITE_OTHER): Payer: Self-pay | Admitting: Adult Health

## 2017-05-06 VITALS — BP 120/80 | HR 98 | Ht 60.0 in | Wt 145.0 lb

## 2017-05-06 DIAGNOSIS — N920 Excessive and frequent menstruation with regular cycle: Secondary | ICD-10-CM

## 2017-05-06 DIAGNOSIS — D259 Leiomyoma of uterus, unspecified: Secondary | ICD-10-CM

## 2017-05-06 MED ORDER — MEGESTROL ACETATE 40 MG PO TABS: ORAL_TABLET | ORAL | 6 refills | Status: DC

## 2017-05-06 NOTE — Patient Instructions (Signed)
Continue megace for now

## 2017-05-06 NOTE — Progress Notes (Signed)
Subjective:     Patient ID: Judy Mcdaniel, female   DOB: 10/23/1970, 46 y.o.   MRN: [id number]  HPI Judy Mcdaniel is a 46 year old Hispanic female, back in follow up of taking megace and has not had any more bleeding.  Review of Systems No more bleeding Reviewed past medical,surgical, social and family history. Reviewed medications and allergies.     Objective:   Physical Exam BP 120/80 (BP Location: Left Arm, Patient Position: Sitting, Cuff Size: Small)   Pulse 98   Ht 5' (1.524 m)   Wt 145 lb (65.8 kg)   BMI 28.32 kg/m  Skin warm and dry. Neck: mid line trachea, normal thyroid, good ROM, no lymphadenopathy noted. Lungs: clear to ausculation bilaterally. Cardiovascular: regular rate and rhythm. Continue megace for now.     Assessment:     1. Menorrhagia with regular cycle   2. Uterine leiomyoma, unspecified location       Plan:     Meds ordered this encounter  Medications  . megestrol (MEGACE) 40 MG tablet    Sig: Take 2 daily    Dispense:  60 tablet    Refill:  6    Order Specific Question:   Supervising Provider    Answer:   Tania Ade H [[id number]]  F/U in 5 months or sooner if needed

## 2017-07-08 ENCOUNTER — Ambulatory Visit: Payer: Self-pay | Admitting: Physician Assistant

## 2017-07-20 ENCOUNTER — Encounter: Payer: Self-pay | Admitting: Physician Assistant

## 2017-08-06 ENCOUNTER — Encounter: Payer: Self-pay | Admitting: Physician Assistant

## 2017-08-06 ENCOUNTER — Other Ambulatory Visit (HOSPITAL_COMMUNITY)
Admission: RE | Admit: 2017-08-06 | Discharge: 2017-08-06 | Disposition: A | Discharge status: Home / Self Care | Payer: Self-pay | Source: Ambulatory Visit | Attending: Physician Assistant | Admitting: Physician Assistant

## 2017-08-06 ENCOUNTER — Ambulatory Visit: Payer: Self-pay | Admitting: Physician Assistant

## 2017-08-06 VITALS — BP 120/78 | HR 81 | Temp 97.9°F | Ht 60.0 in | Wt 148.0 lb

## 2017-08-06 DIAGNOSIS — N926 Irregular menstruation, unspecified: Secondary | ICD-10-CM | POA: Insufficient documentation

## 2017-08-06 DIAGNOSIS — D649 Anemia, unspecified: Secondary | ICD-10-CM | POA: Insufficient documentation

## 2017-08-06 DIAGNOSIS — R202 Paresthesia of skin: Secondary | ICD-10-CM

## 2017-08-06 DIAGNOSIS — E785 Hyperlipidemia, unspecified: Secondary | ICD-10-CM

## 2017-08-06 LAB — HEMOGLOBIN AND HEMATOCRIT, BLOOD
HCT: 43.8 % (ref 36.0–46.0)
Hemoglobin: 13.4 g/dL (ref 12.0–15.0)

## 2017-08-06 LAB — LIPID PANEL
CHOL/HDL RATIO: 3.8 ratio
Cholesterol: 171 mg/dL (ref 0–200)
HDL: 45 mg/dL (ref >40)
LDL CALC: 105 mg/dL — AB (ref 0–99)
TRIGLYCERIDES: 105 mg/dL (ref <150)
VLDL: 21 mg/dL (ref 0–40)

## 2017-08-06 NOTE — Progress Notes (Signed)
BP 120/78 (BP Location: Left Arm, Patient Position: Sitting, Cuff Size: Normal)   Pulse 81   Temp 97.9 F (36.6 C)   Ht 5' (1.524 m)   Wt 148 lb (67.1 kg)   SpO2 97%   BMI 28.90 kg/m    Subjective:    Patient ID: Judy Mcdaniel, female    DOB: [birth year], 47 y.o.   MRN: [id number]  HPI: Judy Mcdaniel is a 47 y.o. female presenting on 08/06/2017 for Hyperlipidemia and Anemia   HPI   pt inquires about hands tingling at times, mostly at night when asleep and when driving.   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 Interim medical history since our last visit reviewed. Allergies and medications reviewed and updated.   Current Outpatient Medications:  .  megestrol (MEGACE) 40 MG tablet, Take 2 daily, Disp: 60 tablet, Rfl: 6   Review of Systems  Constitutional: Positive for chills. Negative for appetite change, diaphoresis, fatigue, fever and unexpected weight change.  HENT: Positive for sneezing. Negative for congestion, drooling, ear pain, facial swelling, hearing loss, mouth sores, sore throat, trouble swallowing and voice change.   Eyes: Positive for redness and itching. Negative for pain, discharge and visual disturbance.  Respiratory: Negative for cough, choking, shortness of breath and wheezing.   Cardiovascular: Negative for chest pain, palpitations and leg swelling.  Gastrointestinal: Negative for abdominal pain, blood in stool, constipation, diarrhea and vomiting.  Endocrine: Negative for cold intolerance, heat intolerance and polydipsia.  Genitourinary: Negative for decreased urine volume, dysuria and hematuria.  Musculoskeletal: Positive for back pain. Negative for arthralgias and gait problem.  Skin: Negative for rash.  Allergic/Immunologic: Negative for  environmental allergies.  Neurological: Positive for headaches. Negative for seizures, syncope and light-headedness.  Hematological: Negative for adenopathy.  Psychiatric/Behavioral: Negative for agitation, dysphoric mood and suicidal ideas. The patient is not nervous/anxious.     Per HPI unless specifically indicated above     Objective:    BP 120/78 (BP Location: Left Arm, Patient Position: Sitting, Cuff Size: Normal)   Pulse 81   Temp 97.9 F (36.6 C)   Ht 5' (1.524 m)   Wt 148 lb (67.1 kg)   SpO2 97%   BMI 28.90 kg/m   Wt Readings from Last 3 Encounters:  08/06/17 148 lb (67.1 kg)  05/06/17 145 lb (65.8 kg)  03/12/17 145 lb (65.8 kg)    Physical Exam  Constitutional: She is oriented to person, place, and time. She appears well-developed and well-nourished.  HENT:  Head: Normocephalic and atraumatic.  Neck: Neck supple.  Cardiovascular: Normal rate and regular rhythm.  Pulses:      Radial pulses are 2+ on the right side, and 2+ on the left side.  Pulmonary/Chest: Effort normal and breath sounds normal.  Abdominal: Soft. Bowel sounds are normal. She exhibits no mass. There is no hepatosplenomegaly. There is no tenderness.  Musculoskeletal: She exhibits no edema.       Right shoulder: Normal.       Left shoulder: Normal.       Right elbow: Normal.      Left elbow: Normal.       Right wrist: Normal.       Left wrist: Normal.  Lymphadenopathy:    She has no cervical adenopathy.  Neurological:  She is alert and oriented to person, place, and time. She has normal strength. She displays no atrophy. No sensory deficit. She exhibits normal muscle tone. Coordination normal.  Skin: Skin is warm and dry.  Psychiatric: She has a normal mood and affect. Her behavior is normal.  Vitals reviewed.   Results for orders placed or performed during the hospital encounter of 08/06/17  Hemoglobin and hematocrit, blood  Result Value Ref Range   Hemoglobin 13.4 12.0 - 15.0 g/dL   HCT  43.8 36.0 - 46.0 %  Lipid Profile  Result Value Ref Range   Cholesterol 171 0 - 200 mg/dL   Triglycerides 105 <150 mg/dL   HDL 45 >40 mg/dL   Total CHOL/HDL Ratio 3.8 RATIO   VLDL 21 0 - 40 mg/dL   LDL Cholesterol 105 (H) 0 - 99 mg/dL      Assessment & Plan:    Encounter Diagnoses  Name Primary?  Marland Kitchen Anemia, unspecified type Yes  . Hyperlipidemia, unspecified hyperlipidemia type   . Abnormal menses   . Tingling     -reviewed labs with pt -pt to Continue lowfat diet -anemia doing well -ordered screening mammogram -discussed with pt that exam is normal and sounds like she just needed to reposition to alleviate the tingling of her hands.  She should RTO if this problem becomes worse or if she has new symtpoms -follow up 1 year.  RTO sooner prn

## 2017-08-07 ENCOUNTER — Other Ambulatory Visit: Payer: Self-pay | Admitting: Physician Assistant

## 2017-08-07 DIAGNOSIS — Z1231 Encounter for screening mammogram for malignant neoplasm of breast: Secondary | ICD-10-CM

## 2017-08-07 LAB — FOLLICLE STIMULATING HORMONE: FSH: 6.7 m[IU]/mL

## 2017-10-08 ENCOUNTER — Ambulatory Visit (HOSPITAL_COMMUNITY)
Admission: RE | Admit: 2017-10-08 | Discharge: 2017-10-08 | Disposition: A | Discharge status: Home / Self Care | Payer: Self-pay | Source: Ambulatory Visit | Attending: Physician Assistant | Admitting: Physician Assistant

## 2017-10-08 ENCOUNTER — Encounter (HOSPITAL_COMMUNITY): Payer: Self-pay

## 2017-10-08 DIAGNOSIS — Z1231 Encounter for screening mammogram for malignant neoplasm of breast: Secondary | ICD-10-CM | POA: Insufficient documentation

## 2017-10-28 ENCOUNTER — Ambulatory Visit: Payer: Self-pay | Admitting: Physician Assistant

## 2017-10-28 ENCOUNTER — Encounter: Payer: Self-pay | Admitting: Physician Assistant

## 2017-10-28 VITALS — BP 135/85 | HR 68 | Temp 97.9°F | Ht 60.0 in | Wt 153.2 lb

## 2017-10-28 DIAGNOSIS — M79644 Pain in right finger(s): Secondary | ICD-10-CM

## 2017-10-28 DIAGNOSIS — M79645 Pain in left finger(s): Principal | ICD-10-CM

## 2017-10-28 DIAGNOSIS — M722 Plantar fascial fibromatosis: Secondary | ICD-10-CM

## 2017-10-28 MED ORDER — NAPROXEN 500 MG PO TABS: ORAL_TABLET | ORAL | 1 refills | Status: DC

## 2017-10-28 NOTE — Progress Notes (Signed)
BP 135/85 (BP Location: Right Arm, Patient Position: Sitting, Cuff Size: Normal)   Pulse 68   Temp 97.9 F (36.6 C)   Ht 5' (1.524 m)   Wt 153 lb 4 oz (69.5 kg)   LMP 10/06/2017   SpO2 99%   BMI 29.93 kg/m    Subjective:    Patient ID: Jerilynn Mages, female    DOB: Jan 22, 1971, 47 y.o.   MRN: [id number]  HPI: Kalee Holstrom is a 47 y.o. female presenting on 10/28/2017 for Hand Pain (bilateral hand joint pain. pt states going on for about 2-3 months.)   HPI   Chief Complaint  Patient presents with  . Hand Pain    bilateral hand joint pain. pt states going on for about 2-3 months.     Pt works in Engineer, civil (consulting).  She says she hasn't been working much lately since the beginning of the year.   She says she can't straighten her thumbs on both hands.  She has trouble opening water bottles and she has trouble when she has to lift something with both hands.   She says sometimes during the night the other fingers hurt but mostly it is the thumbs.  Not feeling numb now but sometimes at night they feel a little bit like that.    She uses a tag gun which does not use her thumbs.  She also c/o L heel pain and the ankle swells.     Relevant past medical, surgical, family and social history reviewed and updated as indicated. Interim medical history since our last visit reviewed. Allergies and medications reviewed and updated.   Current Outpatient Medications:  .  megestrol (MEGACE) 40 MG tablet, Take 2 daily, Disp: 60 tablet, Rfl: 6  Review of Systems  Constitutional: Negative for appetite change, chills, diaphoresis, fatigue, fever and unexpected weight change.  HENT: Negative for congestion, dental problem, drooling, ear pain, facial swelling, hearing loss, mouth sores, sneezing, sore throat, trouble swallowing and voice change.   Eyes: Positive for redness and itching. Negative for pain, discharge and visual disturbance.  Respiratory: Negative for cough, choking,  shortness of breath and wheezing.   Cardiovascular: Positive for leg swelling. Negative for chest pain and palpitations.  Gastrointestinal: Negative for abdominal pain, blood in stool, constipation, diarrhea and vomiting.  Endocrine: Positive for cold intolerance. Negative for heat intolerance and polydipsia.  Genitourinary: Negative for decreased urine volume, dysuria and hematuria.  Musculoskeletal: Positive for arthralgias and gait problem. Negative for back pain.  Skin: Negative for rash.  Allergic/Immunologic: Negative for environmental allergies.  Neurological: Negative for seizures, syncope, light-headedness and headaches.  Hematological: Negative for adenopathy.  Psychiatric/Behavioral: Negative for agitation, dysphoric mood and suicidal ideas. The patient is not nervous/anxious.     Per HPI unless specifically indicated above     Objective:    BP 135/85 (BP Location: Right Arm, Patient Position: Sitting, Cuff Size: Normal)   Pulse 68   Temp 97.9 F (36.6 C)   Ht 5' (1.524 m)   Wt 153 lb 4 oz (69.5 kg)   LMP 10/06/2017   SpO2 99%   BMI 29.93 kg/m   Wt Readings from Last 3 Encounters:  10/28/17 153 lb 4 oz (69.5 kg)  08/06/17 148 lb (67.1 kg)  05/06/17 145 lb (65.8 kg)    Physical Exam  Constitutional: She is oriented to person, place, and time. She appears well-developed and well-nourished.  HENT:  Head: Normocephalic and atraumatic.  Pulmonary/Chest: Effort normal. No respiratory distress.  Musculoskeletal:       Left ankle: Normal.       Right hand: She exhibits tenderness. She exhibits normal range of motion, no bony tenderness, normal capillary refill, no deformity, no laceration and no swelling.       Left hand: She exhibits tenderness. She exhibits normal range of motion, no bony tenderness, normal capillary refill, no deformity, no laceration and no swelling.       Left foot: There is tenderness. There is normal range of motion, no bony tenderness, no swelling  and no deformity.  Bilateral tenderness webspace between thumb and index finger, with pressure applied towards the base of the thumb. Tenderness L heel, plantar surface  Neurological: She is alert and oriented to person, place, and time.  Skin: Skin is warm and dry.  Psychiatric: She has a normal mood and affect. Her behavior is normal.  Nursing note and vitals reviewed.       Assessment & Plan:   Encounter Diagnoses  Name Primary?  . Bilateral thumb pain Yes  . Plantar fasciitis    -pt counseled on ice, naproxen for both thumb and foot -pt counseled to avoid walking barefoot and to wear good supportive shoes -pt counseled to avoid overuse of the thumbs -pt was given rx for naprosyn to use bid x 1 month and then prn -pt to follow up as scheduled.  RTO if worsens or persists

## 2017-10-28 NOTE — Patient Instructions (Signed)
Fascitis plantar  (Plantar Fasciitis)  La fascitis plantar es una afeccin dolorosa que se produce en el taln. Ocurre cuando la banda de tejido que conecta los dedos con el hueso del taln (fascia plantar) se irrita. Esto puede ocurrir despus de hacer mucho ejercicio u otras actividades repetitivas (lesin por uso excesivo). El dolor de la fascitis plantar puede ser de leve (irritacin) a intenso, y en los casos ms agudos puede dificultar que la persona camine o se mueva. Por lo general, el dolor es peor a la maana o despus de permanecer sentado o acostado durante un perodo.  CAUSAS  Este trastorno puede ser causado por:   Estar de pie durante largos perodos.   Usar zapatos que no calcen bien.   Practicar actividades de alto impacto, como correr, o hacer ejercicios aerbicos o ballet.   Tener sobrepeso.   Tener una forma de caminar (marcha) anormal.   Tener los msculos de la pantorrilla tensos.   Tener el arco alto en los pies.   Comenzar una nueva actividad fsica.  SNTOMAS  El sntoma principal de esta afeccin es el dolor en el taln. Otros sntomas pueden ser los siguientes:   Dolor que empeora despus de una actividad o un ejercicio.   Dolor ms intenso a la maana o despus de descansar.   Dolor que desaparece despus de caminar durante unos minutos.  DIAGNSTICO  Esta afeccin se puede diagnosticar en funcin de los signos y los sntomas. El mdico tambin le realizar un examen fsico para controlar si tiene lo siguiente:   Un rea dolorida en la parte inferior del pie.   El arco alto.   Dolor al mover el pie.   Dificultad para mover el pie.  Tambin puede necesitar estudios por imgenes para confirmar el diagnstico. Estos pueden incluir los siguientes:   Radiografas.   Ecografa.   Resonancia magntica.  TRATAMIENTO  El tratamiento de la fascitis plantar depende de la gravedad de la afeccin. El tratamiento puede incluir lo siguiente:   Reposo, hielo y analgsicos de venta  libre para controlar el dolor.   Ejercicios para estirar las pantorrillas y la fascia plantar.   Una frula que mantiene el pie estirado y hacia arriba mientras usted duerme (frula nocturna).   Fisioterapia para aliviar los sntomas y evitar problemas en el futuro.   Inyecciones de cortisona para aliviar el dolor intenso.   Tratamiento con ondas de choque extracorpreas para estimular con impulsos elctricos la fascia plantar lesionada. Esto suele usarse como un ltimo recurso antes de la ciruga.   Ciruga, si los otros tratamientos no han funcionado despus de 12meses.  INSTRUCCIONES PARA EL CUIDADO EN EL HOGAR   Tome los medicamentos solamente como se lo haya indicado el mdico.   Evite las actividades que causan dolor.   Frote la parte inferior del pie sobre una bolsa de hielo o una botella de agua fra. Haga esto durante 20minutos, de 3a 4veces al da.   Realice estiramientos simples como se lo haya indicado el mdico.   Trate de usar calzado deportivo con amortiguacin de aire o gel, o plantillas blandas.   Si el mdico se lo ha indicado, use una frula nocturna para dormir.   Cumpla con todas las visitas de control, segn le indique su mdico.  PREVENCIN   No realice ejercicios ni actividades que le causen dolor en el taln.   Considere la posibilidad de empezar actividades de bajo impacto si sigue teniendo problemas.   Pierda peso si   de [employer] diarias. Esta informacin no tiene Marine scientist el consejo del mdico. Asegrese de hacerle al mdico cualquier pregunta que tenga. Document Released: 03/12/2005 Document Revised: 09/24/2015 Document Reviewed: 04/12/2014 Elsevier  Interactive Patient Education  2018 [employer].   --------------------------------------------------------------------------------  Tendinitis (Tendinitis) La tendinitis es la inflamacin de un tendn. Un tendn es un cordn fuerte de tejido que conecta el msculo con el hueso. La tendinitis puede afectar cualquier tendn, pero afecta ms comnmente al tendn del hombro (manguito rotador), al tendn del tobillo (tendn de [place]), al tendn del codo (tendn del trceps), o a uno de los tendones de la [employer]. CAUSAS Esta afeccin puede ser causada por lo siguiente:  Uso excesivo de un tendn o msculo. Esto es frecuente.  Desgaste relacionado con la edad.  Lesiones.  Cuadros inflamatorios, como la artritis.  Ciertos medicamentos. FACTORES DE RIESGO Esta afeccin es ms probable que se desarrolle en las personas que realizan actividades que implican movimientos repetitivos. SNTOMAS Los sntomas de esta afeccin pueden incluir lo siguiente:  Social research officer, government.  Dolor a Secretary/administrator.  Inflamacin leve. DIAGNSTICO Esta afeccin se diagnostica mediante un examen fsico. Tambin pueden hacerle estudios, por ejemplo:  Ecografa. Esto utiliza ondas de sonido para tomar una imagen del rea afectada.  Una resonancia magntica (RM). TRATAMIENTO Esta afeccin puede tratarse con reposo, aplicando hielo, presin (compresin), y al levantar (elevar) la zona afectada por encima del nivel del corazn. Esto se conoce como terapia RHCE. El tratamiento tambin puede incluir lo siguiente:  Medicamentos para ayudar a Fish farm manager.  Ejercicios o terapia fsica para fortalecer y Location manager tendn.  Un dispositivo ortopdico o una frula.  Ciruga (poco frecuente).   IControl del dolor, la rigidez y la hinchazn  Si se lo indican, aplique hielo en la zona afectada. ? Ponga el hielo en una bolsa plstica. ? Coloque una [employer] piel y la bolsa de hielo. ? Coloque el  hielo durante 20 minutos, 2 a 3 veces por da.  Si se lo indican, aplique calor en la zona afectada tan frecuentemente como se lo haya indicado el mdico. Use la fuente de calor que el mdico le recomiende, como una compresa de calor hmedo o una almohadilla trmica. ? Coloque una [employer] piel y la fuente de Freight forwarder. ? Aplique el calor durante 20 a 81minutos. ? Retire la fuente de calor si la piel se le pone de color rojo brillante. Esto es muy importante si no puede sentir el dolor, el calor o el fro. Puede correr un riesgo mayor de sufrir quemaduras.  Mueva los dedos de las manos o de los pies (de los miembros afectados) con frecuencia, si esto corresponde. Esto puede ayudar a English as a second language teacher entumecimiento y Air cabin crew.  Si se lo indican, eleve la zona afectada por encima del nivel del corazn cuando est sentado o acostado. Conducir  No conduzca ni opere maquinaria pesada mientras toma analgsicos recetados.  Pregntele al mdico cundo puede volver a conducir vehculos, si tiene una frula o un dispositivo ortopdico en el brazo o la pierna. Actividad  Reanude sus actividades normales como se lo haya indicado el mdico. Pregntele al mdico qu actividades son seguras para usted.  Haga reposo para descansar la zona afectada, como se lo haya indicado el mdico.  Evite usar la zona afectada mientras experimenta sntomas de tendinitis.  Haga ejercicios como se lo haya indicado el mdico. Instrucciones generales  No ejerza presin en ninguna  parte de la frula hasta que se haya endurecido por completo. Esto puede tardar varias horas.  Use una venda elstica o de compresin nicamente como se lo haya indicado el mdico.  Tome los medicamentos de venta libre y los recetados solamente como se lo haya indicado el mdico.  Consulting civil engineer a todas las visitas de control como se lo haya indicado el mdico. Esto es importante. SOLICITE ATENCIN MDICA SI:  Los sntomas no  mejoran.  Surgen problemas nuevos que son inexplicables, como entumecimiento en las manos. Esta informacin no tiene Marine scientist el consejo del mdico. Asegrese de hacerle al mdico cualquier pregunta que tenga. Document Released: 03/12/2005 Document Revised: 09/24/2015 Document Reviewed: 03/05/2015 Elsevier Interactive Patient Education  [employer].

## 2018-02-03 ENCOUNTER — Ambulatory Visit: Payer: Self-pay | Admitting: Physician Assistant

## 2018-02-03 ENCOUNTER — Encounter: Payer: Self-pay | Admitting: Physician Assistant

## 2018-02-03 ENCOUNTER — Other Ambulatory Visit (HOSPITAL_COMMUNITY)
Admission: RE | Admit: 2018-02-03 | Discharge: 2018-02-03 | Disposition: A | Discharge status: Home / Self Care | Payer: Self-pay | Source: Ambulatory Visit | Attending: Physician Assistant | Admitting: Physician Assistant

## 2018-02-03 VITALS — BP 130/80 | HR 77 | Temp 98.2°F | Ht 60.0 in | Wt 156.0 lb

## 2018-02-03 DIAGNOSIS — Z862 Personal history of diseases of the blood and blood-forming organs and certain disorders involving the immune mechanism: Secondary | ICD-10-CM | POA: Insufficient documentation

## 2018-02-03 DIAGNOSIS — R42 Dizziness and giddiness: Secondary | ICD-10-CM

## 2018-02-03 LAB — CBC
HCT: 44.1 % (ref 36.0–46.0)
Hemoglobin: 14.2 g/dL (ref 12.0–15.0)
MCH: 28.1 pg (ref 26.0–34.0)
MCHC: 32.2 g/dL (ref 30.0–36.0)
MCV: 87.2 fL (ref 78.0–100.0)
PLATELETS: 307 10*3/uL (ref 150–400)
RBC: 5.06 MIL/uL (ref 3.87–5.11)
RDW: 14.9 % (ref 11.5–15.5)
WBC: 7.5 10*3/uL (ref 4.0–10.5)

## 2018-02-03 MED ORDER — MECLIZINE HCL 25 MG PO TABS: ORAL_TABLET | ORAL | 1 refills | Status: DC

## 2018-02-03 NOTE — Progress Notes (Signed)
BP 130/80 (BP Location: Left Arm, Patient Position: Sitting, Cuff Size: Normal)   Pulse 77   Temp 98.2 F (36.8 C)   Ht 5' (1.524 m)   Wt 156 lb (70.8 kg)   SpO2 99%   BMI 30.47 kg/m    Subjective:    Patient ID: Judy Mcdaniel, female    DOB: [birth year], 47 y.o.   MRN: [id number]  HPI: Judy Mcdaniel is a 47 y.o. female presenting on 02/03/2018 for Headache (eye pain, and dizziness. for about a month. pt states she takes extra electro (OTC) which helps.)   HPI   Chief Complaint  Patient presents with  . Headache    eye pain, and dizziness. for about a month. pt states she takes extra electro (OTC) which helps.   Pt states eye pain all the time.  The OTC electro (pain medication she got [place] a [place]) makes the HA go away but her eyes still hurt.   The HA started 1 month ago but the eyes started hurting about 6 months ago.   She thinks she went to an eye exam one time a long time ago but she doesn't remember where.  From what she describes it doesn't sound like a real eye exam but a screening vision clinic.   Pt denies cough, nasal congestion.  She does sneeze sometimes.    Relevant past medical, surgical, family and social history reviewed and updated as indicated. Interim medical history since our last visit reviewed. Allergies and medications reviewed and updated.  Review of Systems  Constitutional: Negative for appetite change, chills, diaphoresis, fatigue, fever and unexpected weight change.  HENT: Positive for dental problem and sneezing. Negative for congestion, drooling, ear pain, facial swelling, hearing loss, mouth sores, sore throat, trouble swallowing and voice change.   Eyes: Positive for pain, redness and itching. Negative for discharge and visual disturbance.  Respiratory: Negative for cough, choking, shortness of breath and wheezing.   Cardiovascular: Negative for chest pain, palpitations and leg swelling.  Gastrointestinal: Negative for abdominal  pain, blood in stool, constipation, diarrhea and vomiting.  Endocrine: Negative for cold intolerance, heat intolerance and polydipsia.  Genitourinary: Positive for decreased urine volume. Negative for dysuria and hematuria.  Musculoskeletal: Negative for arthralgias, back pain and gait problem.  Skin: Negative for rash.  Allergic/Immunologic: Negative for environmental allergies.  Neurological: Positive for light-headedness and headaches. Negative for seizures and syncope.  Hematological: Negative for adenopathy.  Psychiatric/Behavioral: Negative for agitation, dysphoric mood and suicidal ideas. The patient is not nervous/anxious.     Per HPI unless specifically indicated above     Objective:    BP 130/80 (BP Location: Left Arm, Patient Position: Sitting, Cuff Size: Normal)   Pulse 77   Temp 98.2 F (36.8 C)   Ht 5' (1.524 m)   Wt 156 lb (70.8 kg)   SpO2 99%   BMI 30.47 kg/m   Wt Readings from Last 3 Encounters:  02/03/18 156 lb (70.8 kg)  10/28/17 153 lb 4 oz (69.5 kg)  08/06/17 148 lb (67.1 kg)     Orthostatic VS for the past 24 hrs:  BP- Lying Pulse- Lying BP- Sitting Pulse- Sitting BP- Standing at 0 minutes Pulse- Standing at 0 minutes  02/03/18 1325 132/84 78 (!) 139/94 86 134/89 89    Visual acuity noted  Physical Exam  Constitutional: She is oriented to person, place, and time. She appears well-developed and well-nourished.  HENT:  Head: Normocephalic and atraumatic.  Eyes: Pupils are  equal, round, and reactive to light. Conjunctivae, EOM and lids are normal.  Fundoscopic exam unremarkable. EOM testing elicits dizziness. No nystagmus.   Neck: Neck supple.  Cardiovascular: Normal rate and regular rhythm.  Pulmonary/Chest: Effort normal and breath sounds normal.  Abdominal: Soft. Bowel sounds are normal. She exhibits no mass. There is no hepatosplenomegaly. There is no tenderness.  Musculoskeletal: She exhibits no edema.  Lymphadenopathy:    She has no cervical  adenopathy.  Neurological: She is alert and oriented to person, place, and time. She has normal strength and normal reflexes. No cranial nerve deficit or sensory deficit. Coordination and gait normal.  Skin: Skin is warm and dry.  Psychiatric: She has a normal mood and affect. Her behavior is normal.  Vitals reviewed.        Assessment & Plan:   Encounter Diagnoses  Name Primary?  . Vertigo Yes  . History of anemia     -Check h/h in light of history anemia Although not likely since menses light and irregular at this point -rx meclizine and counseled on vertigo -urged pt to go to eye doctor for an eye exam for slit lamp exam and glaucoma testing -pt to follow up her as scheduled or RTO sooner if symptoms persist

## 2018-02-03 NOTE — Patient Instructions (Signed)
Vrtigo (Vertigo) El trmino vrtigo hace referencia a la sensacin de que se est moviendo cuando no es as. El vrtigo puede causarle la sensacin de que las cosas que lo rodean se estn moviendo cuando en realidad eso no sucede. Esta sensacin puede aparecer y desaparecer en cualquier momento. El vrtigo suele desaparecer solo. CUIDADOS EN EL HOGAR  No haga movimientos rpidos.  No conduzca.  No use maquinaria pesada.  No haga nada que pueda ser peligroso para usted o para Producer, television/film/video en el caso de que se produjera una crisis de vrtigo.  Sintese de inmediato si est mareado o tiene dificultad para mantener el equilibrio.  Tome los medicamentos de venta libre y los recetados solamente como se lo haya indicado el mdico.  Siga las indicaciones de mdico en lo que respecta a las posiciones o los movimientos que Nurse, adult.  Beba suficiente lquido para mantener el pis (orina) claro o de color amarillo plido.  Concurra a todas las visitas de control como se lo haya indicado el mdico. Esto es importante. SOLICITE AYUDA SI:  Los medicamentos no le [employer] vrtigo.  Tiene fiebre.  Los problemas empeoran o le aparecen sntomas nuevos.  Sus familiares o amigos observan cambios en su comportamiento.  Tiene malestar estomacal (nuseas) o vomita.  Tiene sensacin de hormigueo o se le adormece una parte del cuerpo. SOLICITE AYUDA DE INMEDIATO SI:  Tiene dificultad para moverse o para caminar.  Esta mareado todo [employer].  Pierde el conocimiento (se desmaya).  Tiene dolores de [place] muy intensos.  Se siente dbil o tiene problemas para [employer], los brazos o las piernas.  Tiene cambios en la audicin.  Tiene cambios en la visin.  Tiene rigidez en el cuello.  La luz brillante empieza a molestarlo. Esta informacin no tiene Marine scientist el consejo del mdico. Asegrese de hacerle al mdico cualquier pregunta que tenga. Document Released: 07/05/2010  Document Revised: 02/21/2015 Document Reviewed: 09/25/2014 Elsevier Interactive Patient Education  [employer].

## 2018-02-11 ENCOUNTER — Other Ambulatory Visit: Payer: Self-pay | Admitting: Adult Health

## 2018-02-12 ENCOUNTER — Telehealth: Payer: Self-pay | Admitting: Adult Health

## 2018-02-12 ENCOUNTER — Other Ambulatory Visit: Payer: Self-pay | Admitting: Women's Health

## 2018-02-12 MED ORDER — MEGESTROL ACETATE 40 MG PO TABS: ORAL_TABLET | ORAL | 0 refills | Status: DC

## 2018-02-12 NOTE — Telephone Encounter (Signed)
Patient called stating that she has called her pharmacy and had her refill faxed over yesterday but nothing was faxed back. I let patient know Anderson Malta is not in the office today, but patient states that she is completely out and would like another provider to fill it. Pt has an appointment scheduled for 03/11/2018, Please contact pt

## 2018-03-11 ENCOUNTER — Telehealth: Payer: Self-pay | Admitting: Women's Health

## 2018-03-11 ENCOUNTER — Ambulatory Visit: Payer: Self-pay | Admitting: Adult Health

## 2018-03-11 NOTE — Telephone Encounter (Signed)
Spoke with pt's daughter. Pt has appt 03/23/18. Pt is requesting a refill on Megace. Please advise. Thanks!! [place]

## 2018-03-11 NOTE — Telephone Encounter (Signed)
Pt said she has a question for [employer] booker/

## 2018-03-12 MED ORDER — MEGESTROL ACETATE 40 MG PO TABS: ORAL_TABLET | ORAL | 6 refills | Status: DC

## 2018-03-12 NOTE — Telephone Encounter (Signed)
Refilled megace  

## 2018-03-23 ENCOUNTER — Encounter: Payer: Self-pay | Admitting: Adult Health

## 2018-03-23 ENCOUNTER — Ambulatory Visit (INDEPENDENT_AMBULATORY_CARE_PROVIDER_SITE_OTHER): Payer: Self-pay | Admitting: Adult Health

## 2018-03-23 VITALS — BP 139/87 | HR 67 | Ht 60.0 in | Wt 155.0 lb

## 2018-03-23 DIAGNOSIS — N920 Excessive and frequent menstruation with regular cycle: Secondary | ICD-10-CM

## 2018-03-23 DIAGNOSIS — D259 Leiomyoma of uterus, unspecified: Secondary | ICD-10-CM

## 2018-03-23 DIAGNOSIS — G5601 Carpal tunnel syndrome, right upper limb: Secondary | ICD-10-CM | POA: Insufficient documentation

## 2018-03-23 MED ORDER — MEGESTROL ACETATE 40 MG PO TABS: ORAL_TABLET | ORAL | 6 refills | Status: DC

## 2018-03-23 NOTE — Progress Notes (Signed)
  Subjective:     Patient ID: Judy Mcdaniel, female   DOB: 21-Sep-1970, 47 y.o.   MRN: [id number]  HPI Judy Mcdaniel is a 47 year old Hispanic, back in follow up of taking megace for bleeding.  She is also seen at [facility] and had CBC 02/03/18 and HGB was 14.2 Judy Mcdaniel is her interpreter. She has tingling and numbness in hands R>L. She does do work with her hands.   Review of Systems No bleeding when take megace Hands tingle and feels numb at times, esp third and fourth fingers tingle more on right hand Reviewed past medical,surgical, social and family history. Reviewed medications and allergies.     Objective:   Physical Exam BP 139/87 (BP Location: Left Arm, Patient Position: Sitting, Cuff Size: Normal)   Pulse 67   Ht 5' (1.524 m)   Wt 155 lb (70.3 kg)   BMI 30.27 kg/m   Skin warm and dry. Neck: mid line trachea, normal thyroid, good ROM, no lymphadenopathy noted. Lungs: clear to ausculation bilaterally. Cardiovascular: regular rate and rhythm. +phalen sign. Will continue megace.    Assessment:     1. Menorrhagia with regular cycle   2. Uterine leiomyoma, unspecified location   3. Carpal tunnel syndrome of right wrist       Plan:     Meds ordered this encounter  Medications  . megestrol (MEGACE) 40 MG tablet    Sig: TAKE 2 TABLETS BY MOUTH ONCE DAILY    Dispense:  60 tablet    Refill:  6    Please consider 90 day supplies to promote better adherence    Order Specific Question:   Supervising Provider    Answer:   Florian Buff [[id number]]  Order given for right cock up splint Return in 6 months for  physical

## 2018-05-12 ENCOUNTER — Ambulatory Visit (INDEPENDENT_AMBULATORY_CARE_PROVIDER_SITE_OTHER): Payer: Self-pay | Admitting: Obstetrics and Gynecology

## 2018-05-12 ENCOUNTER — Ambulatory Visit: Payer: Self-pay | Admitting: Obstetrics and Gynecology

## 2018-05-12 ENCOUNTER — Encounter: Payer: Self-pay | Admitting: Obstetrics and Gynecology

## 2018-05-12 VITALS — BP 134/77 | HR 97 | Ht 60.75 in | Wt 154.0 lb

## 2018-05-12 DIAGNOSIS — N939 Abnormal uterine and vaginal bleeding, unspecified: Secondary | ICD-10-CM

## 2018-05-12 LAB — POCT HEMOGLOBIN: Hemoglobin: 12.7 g/dL (ref 9.5–13.5)

## 2018-05-12 NOTE — Progress Notes (Signed)
Patient ID: Athziry Millican, female   DOB: 1971-05-25, 47 y.o.   MRN: [id number]    [facility] Visit  @DATE @            Patient name: Domique Reardon MRN [id number]  Date of birth: [birth year]  CC & HPI:  Manali Ditto is a 47 y.o. female presenting today for heavy bleeding. Is taking megace, is taking 2 pills once a day. Wasn't having a period before November, but once November periods cam she has been bleeding since. Has passed numerous clots. She is not using birth control and has had tubes tied. Is past had Depo shot but was still bleeding heavily after injection. .  ROS:  ROS +Menorraghia +uterine fibroid -fever -chills All systems are negative except as noted in the HPI and PMH.  Pertinent History Reviewed:   Reviewed: Significant for tubal ligation Medical         Past Medical History:  Diagnosis Date  . Anemia   . Fibroids 03/10/2017                              Surgical Hx:    Past Surgical History:  Procedure Laterality Date  . CESAREAN SECTION    . HERNIA REPAIR    . TUBAL LIGATION     Medications: Reviewed & Updated - see associated section                       Current Outpatient Medications:  .  Homeopathic Products (BHI ARNICA+ PAIN RELIEF PO), Take 2 tablets by mouth daily as needed., Disp: , Rfl:  .  meclizine (ANTIVERT) 25 MG tablet, 1 po q 8 hour prn dizzines.  1 tableta por boca cada 8 horas cuando sea necesario para los mareos (Patient not taking: Reported on 03/23/2018), Disp: 40 tablet, Rfl: 1 .  megestrol (MEGACE) 40 MG tablet, TAKE 2 TABLETS BY MOUTH ONCE DAILY, Disp: 60 tablet, Rfl: 6 .  naproxen (NAPROSYN) 500 MG tablet, 1 po bid with meal.  Tome una tableta por boca dos veces diarias con comida (Patient not taking: Reported on 02/03/2018), Disp: 60 tablet, Rfl: 1   Social History: Reviewed -  reports that she has never smoked. She has never used smokeless tobacco.  Objective Findings:  Vitals: There were no vitals taken for this  visit.  PHYSICAL EXAMINATION General appearance - alert, well appearing, and in no distress and oriented to person, place, and time Mental status - alert, oriented to person, place, and time, normal mood, behavior, speech, dress, motor activity, and thought processes, affect appropriate to mood  PELVIC Vagina - light menstrual blood, non-purulence  Cervix - multiparous, well supported Uterus - small intramuril fibroid in left upper corner of uterus, 3 cm. Posterior fibroid partial deforms inside lining of uterus, measures 2.4 cm. Collection of tissue in uterus 1.3 cm thick endometrial stripe Bedside TV u/s by JV F showed: uterine fibroid x2 as noted above a 3 cm fundal fibroid intramural, and a submucous lower uterine segment fibroid on the posterior portion of the uterus, 2.4 cm that slightly deforms the posterior portion of the uterine cavity.  There is a significant amount of tissue and debris in the uterine cavity.  I suspect there is both some small clots and endometrial tissue.   Discussion: 1. Discussed with pt benefits of IUD  At end of discussion, pt had opportunity to ask questions and has  no further questions at this time.   Specific discussion of IUD as noted above. Greater than 50% was spent in counseling and coordination of care with the patient.   Total time greater than: 15 minutes.    Assessment & Plan:   A:  1. Menorrhagia 2. Discussion of IUD 3. Uterine Fibroids, intramural and submucosal  P:  1. Discontinue Megace Will discontinue the Megace and allow a withdrawal bleed to occur 1. Gyn f/u in 2 weeks 2. Endometrial biopsy to be considered at that time 3. Repeat TV u/s 4.  If endometrial cavity is been well emptied should be a candidate for an IUD   By signing my name below, I, Samul Dada, attest that this documentation has been prepared under the direction and in the presence of Jonnie Kind, MD. Electronically Signed: [place].  05/12/18. 9:25 AM.  I personally performed the services described in this documentation, which was SCRIBED in my presence. The recorded information has been reviewed and considered accurate. It has been edited as necessary during review. Jonnie Kind, MD

## 2018-05-24 ENCOUNTER — Ambulatory Visit: Payer: Self-pay | Admitting: Obstetrics and Gynecology

## 2018-05-25 ENCOUNTER — Other Ambulatory Visit: Payer: Self-pay | Admitting: Obstetrics and Gynecology

## 2018-05-25 DIAGNOSIS — N920 Excessive and frequent menstruation with regular cycle: Secondary | ICD-10-CM

## 2018-05-25 DIAGNOSIS — D219 Benign neoplasm of connective and other soft tissue, unspecified: Secondary | ICD-10-CM

## 2018-05-25 DIAGNOSIS — N921 Excessive and frequent menstruation with irregular cycle: Secondary | ICD-10-CM

## 2018-05-26 ENCOUNTER — Other Ambulatory Visit: Payer: Self-pay | Admitting: Obstetrics and Gynecology

## 2018-05-26 ENCOUNTER — Encounter: Payer: Self-pay | Admitting: Obstetrics and Gynecology

## 2018-05-26 ENCOUNTER — Other Ambulatory Visit: Payer: Self-pay

## 2018-05-26 ENCOUNTER — Ambulatory Visit (INDEPENDENT_AMBULATORY_CARE_PROVIDER_SITE_OTHER): Payer: Self-pay | Admitting: Obstetrics and Gynecology

## 2018-05-26 ENCOUNTER — Ambulatory Visit: Payer: Self-pay

## 2018-05-26 VITALS — BP 131/68 | HR 85 | Ht 60.5 in | Wt 153.0 lb

## 2018-05-26 DIAGNOSIS — N920 Excessive and frequent menstruation with regular cycle: Secondary | ICD-10-CM

## 2018-05-26 NOTE — Progress Notes (Signed)
Patient ID: Judy Mcdaniel, female   DOB: 04/04/1971, 47 y.o.   MRN: [id number]    [facility] Visit  @DATE @            Patient name: Judy Mcdaniel MRN [id number]  Date of birth: [birth year]  CC & HPI:  Judy Mcdaniel is a 47 y.o. female presenting today for menorrhagia, is still bleeding at present time and has since stopped taking megace since 05/12/18 visit. 2 years she was anemic, she was given medicine at the time and has since resolved itself. Here with translator.  Pt has NOT completed [employer]. ROS:  ROS +menorrhagia +uterine fibroids -fever -chills All systems are negative except as noted in the HPI and PMH.   Pertinent History Reviewed:   Reviewed:  Medical         Past Medical History:  Diagnosis Date  . Anemia   . Fibroids 03/10/2017                              Surgical Hx:    Past Surgical History:  Procedure Laterality Date  . CESAREAN SECTION    . HERNIA REPAIR    . TUBAL LIGATION     Medications: Reviewed & Updated - see associated section                       Current Outpatient Medications:  .  megestrol (MEGACE) 40 MG tablet, TAKE 2 TABLETS BY MOUTH ONCE DAILY (Patient not taking: Reported on 05/26/2018), Disp: 60 tablet, Rfl: 6   Social History: Reviewed -  reports that she has never smoked. She has never used smokeless tobacco.  Objective Findings:  Vitals: Blood pressure 131/68, pulse 85, height 5' 0.5" (1.537 m), weight 153 lb (69.4 kg).  PHYSICAL EXAMINATION General appearance - alert, well appearing, and in no distress and oriented to person, place, and time Mental status - alert, oriented to person, place, and time, normal mood, behavior, speech, dress, motor activity, and thought processes, affect appropriate to mood  PELVIC Vagina- light bleeding.  ENDOMETRIAL BIOPSY Patient given informed consent, signed copy in the chart, time out was performed. Appropriate time out taken. . The patient was placed in the  lithotomy position and the cervix brought into view with sterile speculum.  Portio of cervix cleansed x 2 with betadine swabs.  A tenaculum was placed in the anterior lip of the cervix.  The uterus was sounded for depth of 8 cm. A pipelle was introduced to into the uterus, suction created,  and an endometrial sample was obtained. All equipment was removed and accounted for.  The patient tolerated the procedure well.   Patient given post procedure instructions. The patient will return in 2 weeks for results.  Assessment & Plan:   A:  1. Menorrhagia 2. Uterine fibroids  P:  1. F/u with results in 2 weeks 2. Possible candidate for for IUD 3.  Resume megace bid -tid   By signing my name below, I, Samul Dada, attest that this documentation has been prepared under the direction and in the presence of Jonnie Kind, MD. Electronically Signed: [place]. 05/26/18. 4:34 PM.  I personally performed the services described in this documentation, which was SCRIBED in my presence. The recorded information has been reviewed and considered accurate. It has been edited as necessary during review. Jonnie Kind, MD

## 2018-06-07 ENCOUNTER — Ambulatory Visit (INDEPENDENT_AMBULATORY_CARE_PROVIDER_SITE_OTHER): Payer: Self-pay | Admitting: Obstetrics & Gynecology

## 2018-06-07 ENCOUNTER — Other Ambulatory Visit: Payer: Self-pay

## 2018-06-07 ENCOUNTER — Encounter: Payer: Self-pay | Admitting: Obstetrics & Gynecology

## 2018-06-07 VITALS — BP 135/81 | HR 127 | Ht 61.0 in | Wt 153.0 lb

## 2018-06-07 DIAGNOSIS — D500 Iron deficiency anemia secondary to blood loss (chronic): Secondary | ICD-10-CM

## 2018-06-07 DIAGNOSIS — N921 Excessive and frequent menstruation with irregular cycle: Secondary | ICD-10-CM

## 2018-06-07 LAB — POCT HEMOGLOBIN: Hemoglobin: 9.1 g/dL — AB (ref 11–14.6)

## 2018-06-07 MED ORDER — MEGESTROL ACETATE 40 MG PO TABS: ORAL_TABLET | ORAL | 3 refills | Status: DC

## 2018-06-07 NOTE — Progress Notes (Signed)
Chief Complaint  Patient presents with  . Menorrhagia    with pelvic pain      47 y.o. [id number] No LMP recorded. (Menstrual status: Irregular Periods). The current method of family planning is tubal ligation.  Outpatient Encounter Medications as of 06/07/2018  Medication Sig  . megestrol (MEGACE) 40 MG tablet TAKE 2 TABLETS BY MOUTH ONCE DAILY  . megestrol (MEGACE) 40 MG tablet Take 3 tablets daily   No facility-administered encounter medications on file as of 06/07/2018.     Subjective Pt has been bleeding heavily for 2 weeks there was a mix up with her megestrol with the pharmacy Hemoglobin now 9 Sonogram reveals fibroid distorting the endometrium Past Medical History:  Diagnosis Date  . Anemia   . Fibroids 03/10/2017    Past Surgical History:  Procedure Laterality Date  . CESAREAN SECTION    . HERNIA REPAIR    . TUBAL LIGATION      OB History    Gravida  5   Para  5   Term  5   Preterm      AB      Living  5     SAB      TAB      Ectopic      Multiple      Live Births  5           Allergies  Allergen Reactions  . Pollen Extract Other (See Comments)    Watery eyes    Social History   Socioeconomic History  . Marital status: Married    Spouse name: Not on file  . Number of children: Not on file  . Years of education: Not on file  . Highest education level: Not on file  Occupational History  . Not on file  Social Needs  . Financial resource strain: Not on file  . Food insecurity:    Worry: Not on file    Inability: Not on file  . Transportation needs:    Medical: Not on file    Non-medical: Not on file  Tobacco Use  . Smoking status: Never Smoker  . Smokeless tobacco: Never Used  Substance and Sexual Activity  . Alcohol use: No    Alcohol/week: 0.0 standard drinks  . Drug use: No  . Sexual activity: Not Currently    Birth control/protection: Surgical    Comment: tubal  Lifestyle  . Physical activity:    Days  per week: Not on file    Minutes per session: Not on file  . Stress: Not on file  Relationships  . Social connections:    Talks on phone: Not on file    Gets together: Not on file    Attends religious service: Not on file    Active member of club or organization: Not on file    Attends meetings of clubs or organizations: Not on file    Relationship status: Not on file  Other Topics Concern  . Not on file  Social History Narrative  . Not on file    History reviewed. No pertinent family history.  Medications:       Current Outpatient Medications:  .  megestrol (MEGACE) 40 MG tablet, TAKE 2 TABLETS BY MOUTH ONCE DAILY, Disp: 60 tablet, Rfl: 6 .  megestrol (MEGACE) 40 MG tablet, Take 3 tablets daily, Disp: 90 tablet, Rfl: 3  Objective Blood pressure 135/81, pulse (!) 127, height 5\' 1"  (1.549 m), weight 153 lb (  69.4 kg).   Pertinent ROS   Labs or studies     Impression Diagnoses this Encounter::   ICD-10-CM   1. Menorrhagia with irregular cycle N92.1 POCT hemoglobin  2. Iron deficiency anemia due to chronic blood loss D50.0     Established relevant diagnosis(es):  Plan/Recommendations: Meds ordered this encounter  Medications  . megestrol (MEGACE) 40 MG tablet    Sig: Take 3 tablets daily    Dispense:  90 tablet    Refill:  3    Labs or Scans Ordered: Orders Placed This Encounter  Procedures  . POCT hemoglobin    Management:: >megestrol 3 a day and follow up 2 weeks fo posible definitive surgical management >Had C section with 3 successful VBACs >take OTC iron  Follow up Return in about 2 weeks (around 06/21/2018) for Follow up, with Dr Elonda Husky.        Face to face time:  15 minutes  Greater than 50% of the visit time was spent in counseling and coordination of care with the patient.  The summary and outline of the counseling and care coordination is summarized in the note above.   All questions were answered.

## 2018-06-21 ENCOUNTER — Encounter: Payer: Self-pay | Admitting: Obstetrics & Gynecology

## 2018-06-21 ENCOUNTER — Ambulatory Visit (INDEPENDENT_AMBULATORY_CARE_PROVIDER_SITE_OTHER): Payer: Self-pay | Admitting: Obstetrics & Gynecology

## 2018-06-21 VITALS — BP 121/72 | HR 92 | Ht 61.0 in | Wt 155.0 lb

## 2018-06-21 DIAGNOSIS — D500 Iron deficiency anemia secondary to blood loss (chronic): Secondary | ICD-10-CM

## 2018-06-21 DIAGNOSIS — N921 Excessive and frequent menstruation with irregular cycle: Secondary | ICD-10-CM

## 2018-06-21 LAB — POCT HEMOGLOBIN: HEMOGLOBIN: 10.7 g/dL — AB (ref 11–14.6)

## 2018-06-21 NOTE — Progress Notes (Signed)
Chief Complaint  Patient presents with  . Follow-up      48 y.o. [id number] No LMP recorded. (Menstrual status: Irregular Periods). The current method of family planning is tubal ligation.  Outpatient Encounter Medications as of 06/21/2018  Medication Sig  . megestrol (MEGACE) 40 MG tablet Take 3 tablets daily  . megestrol (MEGACE) 40 MG tablet TAKE 2 TABLETS BY MOUTH ONCE DAILY (Patient not taking: Reported on 06/21/2018)   No facility-administered encounter medications on file as of 06/21/2018.     Subjective Pt has stopped bleeding and hemoglobin is up to 10.7 Recommend meds vs TVH Discussed complications bleeding infection bladder damage in detail Pt understands Translator is present throughout Past Medical History:  Diagnosis Date  . Anemia   . Fibroids 03/10/2017    Past Surgical History:  Procedure Laterality Date  . CESAREAN SECTION    . HERNIA REPAIR    . TUBAL LIGATION      OB History    Gravida  5   Para  5   Term  5   Preterm      AB      Living  5     SAB      TAB      Ectopic      Multiple      Live Births  5           Allergies  Allergen Reactions  . Pollen Extract Other (See Comments)    Watery eyes    Social History   Socioeconomic History  . Marital status: Married    Spouse name: Not on file  . Number of children: Not on file  . Years of education: Not on file  . Highest education level: Not on file  Occupational History  . Not on file  Social Needs  . Financial resource strain: Not on file  . Food insecurity:    Worry: Not on file    Inability: Not on file  . Transportation needs:    Medical: Not on file    Non-medical: Not on file  Tobacco Use  . Smoking status: Never Smoker  . Smokeless tobacco: Never Used  Substance and Sexual Activity  . Alcohol use: No    Alcohol/week: 0.0 standard drinks  . Drug use: No  . Sexual activity: Not Currently    Birth control/protection: Surgical    Comment: tubal    Lifestyle  . Physical activity:    Days per week: Not on file    Minutes per session: Not on file  . Stress: Not on file  Relationships  . Social connections:    Talks on phone: Not on file    Gets together: Not on file    Attends religious service: Not on file    Active member of club or organization: Not on file    Attends meetings of clubs or organizations: Not on file    Relationship status: Not on file  Other Topics Concern  . Not on file  Social History Narrative  . Not on file    No family history on file.  Medications:       Current Outpatient Medications:  .  megestrol (MEGACE) 40 MG tablet, Take 3 tablets daily, Disp: 90 tablet, Rfl: 3 .  megestrol (MEGACE) 40 MG tablet, TAKE 2 TABLETS BY MOUTH ONCE DAILY (Patient not taking: Reported on 06/21/2018), Disp: 60 tablet, Rfl: 6  Objective Blood pressure 121/72, pulse 92, height 5\' 1"  (  1.549 m), weight 155 lb (70.3 kg).    Pertinent ROS   Labs or studies Hemoglobin 10.7    Impression Diagnoses this Encounter::   ICD-10-CM   1. Menorrhagia with irregular cycle N92.1 POCT hemoglobin  2. Iron deficiency anemia due to chronic blood loss D50.0     Established relevant diagnosis(es):   Plan/Recommendations: No orders of the defined types were placed in this encounter.   Labs or Scans Ordered: Orders Placed This Encounter  Procedures  . POCT hemoglobin    Management:: Pt can stay on megestrol vs TVH +/- BSO  Follow up Return in about 3 months (around 09/20/2018) for Follow up, with Dr Elonda Husky.        Face to face time:  15 minutes  Greater than 50% of the visit time was spent in counseling and coordination of care with the patient.  The summary and outline of the counseling and care coordination is summarized in the note above.   All questions were answered.

## 2018-07-14 ENCOUNTER — Other Ambulatory Visit: Payer: Self-pay

## 2018-07-14 ENCOUNTER — Encounter: Payer: Self-pay | Admitting: Obstetrics and Gynecology

## 2018-07-14 ENCOUNTER — Ambulatory Visit (INDEPENDENT_AMBULATORY_CARE_PROVIDER_SITE_OTHER): Payer: Self-pay | Admitting: Obstetrics and Gynecology

## 2018-07-14 VITALS — BP 143/81 | HR 80 | Ht 61.0 in | Wt 155.0 lb

## 2018-07-14 DIAGNOSIS — Z862 Personal history of diseases of the blood and blood-forming organs and certain disorders involving the immune mechanism: Secondary | ICD-10-CM

## 2018-07-14 DIAGNOSIS — N920 Excessive and frequent menstruation with regular cycle: Secondary | ICD-10-CM

## 2018-07-14 LAB — POCT HEMOGLOBIN: Hemoglobin: 15.3 g/dL — AB (ref 11–14.6)

## 2018-07-14 MED ORDER — MEGESTROL ACETATE 40 MG PO TABS: ORAL_TABLET | ORAL | 3 refills | Status: DC

## 2018-07-14 NOTE — Progress Notes (Signed)
Patient ID: Judy Mcdaniel, female   DOB: 1971/01/01, 47 y.o.   MRN: [id number]   [facility] Visit  @DATE @            Patient name: Judy Mcdaniel MRN [id number]  Date of birth: [birth year]  CC & HPI:  [name] is a 48 y.o. female presenting today for menorrhagia. Last week was having some bleeding as if a normal period but periods have not been has heavy since starting megace. She takes 4 a day because taking 2 a day did not help. She continues to take 4 pills to keep bleeding light. She kept taking the megace even during her period.  Describes her mid normal menses last week as manageable.  She has some concern over the number of pills she is taken.  The relatively benign nature of Megace is discussed  ROS:  ROS +menorrhagia currently under control -fever -anemia  All systems are negative except as noted in the HPI and PMH.    Pertinent History Reviewed:   Reviewed: Significant for  Medical         Past Medical History:  Diagnosis Date  . Anemia   . Fibroids 03/10/2017                              Surgical Hx:    Past Surgical History:  Procedure Laterality Date  . CESAREAN SECTION    . HERNIA REPAIR    . TUBAL LIGATION     Medications: Reviewed & Updated - see associated section                       Current Outpatient Medications:  .  megestrol (MEGACE) 40 MG tablet, Take 3 tablets daily, Disp: 90 tablet, Rfl: 3   Social History: Reviewed -  reports that she has never smoked. She has never used smokeless tobacco.  Objective Findings:  Vitals: Blood pressure (!) 143/81, pulse 80, height 5\' 1"  (1.549 m), weight 155 lb (70.3 kg).  PHYSICAL EXAMINATION General appearance - alert, well appearing, and in no distress Mental status - alert, oriented to person, place, and time, normal mood, behavior, speech, dress, motor activity, and thought processes, affect appropriate to mood  PELVIC DEFERRED  Assessment & Plan:   A:  1.  Menorrhagia  P:   1. Reduce megace, BID 2. If heavy bleeding persists return to 4 times a day 3. F/u 1 year or PRN   By signing my name below, I, Samul Dada, attest that this documentation has been prepared under the direction and in the presence of Jonnie Kind, MD Electronically Signed: [place]. 07/14/18. 2:22 PM.  I personally performed the services described in this documentation, which was SCRIBED in my presence. The recorded information has been reviewed and considered accurate. It has been edited as necessary during review. Jonnie Kind, MD

## 2018-08-05 ENCOUNTER — Ambulatory Visit: Payer: Self-pay | Admitting: Physician Assistant

## 2018-08-09 ENCOUNTER — Encounter: Payer: Self-pay | Admitting: Physician Assistant

## 2018-08-11 ENCOUNTER — Encounter: Payer: Self-pay | Admitting: Physician Assistant

## 2018-08-11 ENCOUNTER — Other Ambulatory Visit (HOSPITAL_COMMUNITY)
Admission: RE | Admit: 2018-08-11 | Discharge: 2018-08-11 | Disposition: A | Discharge status: Home / Self Care | Payer: Self-pay | Source: Ambulatory Visit | Attending: Physician Assistant | Admitting: Physician Assistant

## 2018-08-11 ENCOUNTER — Ambulatory Visit: Payer: Self-pay | Admitting: Physician Assistant

## 2018-08-11 VITALS — BP 110/76 | HR 88 | Temp 97.7°F | Ht 61.0 in | Wt 158.0 lb

## 2018-08-11 DIAGNOSIS — R102 Pelvic and perineal pain: Secondary | ICD-10-CM

## 2018-08-11 DIAGNOSIS — N926 Irregular menstruation, unspecified: Secondary | ICD-10-CM

## 2018-08-11 DIAGNOSIS — D649 Anemia, unspecified: Secondary | ICD-10-CM

## 2018-08-11 DIAGNOSIS — K5900 Constipation, unspecified: Secondary | ICD-10-CM

## 2018-08-11 DIAGNOSIS — M79632 Pain in left forearm: Secondary | ICD-10-CM

## 2018-08-11 LAB — CBC
HCT: 44.8 % (ref 36.0–46.0)
Hemoglobin: 13.6 g/dL (ref 12.0–15.0)
MCH: 26.9 pg (ref 26.0–34.0)
MCHC: 30.4 g/dL (ref 30.0–36.0)
MCV: 88.5 fL (ref 80.0–100.0)
Platelets: 336 10*3/uL (ref 150–400)
RBC: 5.06 MIL/uL (ref 3.87–5.11)
RDW: 16.8 % — ABNORMAL HIGH (ref 11.5–15.5)
WBC: 6.9 10*3/uL (ref 4.0–10.5)
nRBC: 0 % (ref 0.0–0.2)

## 2018-08-11 NOTE — Progress Notes (Signed)
BP 110/76 (BP Location: Left Arm, Patient Position: Sitting, Cuff Size: Normal)   Pulse 88   Temp 97.7 F (36.5 C) (Other (Comment))   Ht 5\' 1"  (1.549 m)   Wt 158 lb (71.7 kg)   LMP 08/01/2018 (Approximate)   SpO2 98%   BMI 29.85 kg/m    Subjective:    Patient ID: Judy Mcdaniel, female    DOB: March 30, 1971, 48 y.o.   MRN: [id number]  HPI: Judy Mcdaniel is a 48 y.o. female presenting on 08/11/2018 for Follow-up and Arm Pain (c/o pain to left elbow area)   HPI   Pt states her menses Sunday was very very heavy.  She is having pain in her pelvic area.   Pt saw dr Elonda Husky 1/6 and was told megace vs hysterectomy.  She went again 1/29 due to menses heavy and pelvic pain and saw dr Glo Herring and she was told to stay on megace and f/u 1 year.  Pt says they had a translator there in the office.   Oddly, POCT hg test on 1/6 was 10.7 and 15.3 on 1/29.   Likely test inaccurate.    Pt says she is bleeding now a little bit- started last night.  Her pain in beginning also-   Before last night she had bleeding Sunday night.  Her bleeding stopped Monday.    Prior to the bleeding on Sunday it had been almost a month since she had bleeding.   Last time was when she went to gyn (1/29).  At that time she says she had a lot of bleeding and pelvic pain.   Pt states L arm pain for 1 month.   She says it makes it difficult for her to pick up heavy things.   No injury.  She does not work since she has been having problems with her menses.  She stopped working in December - she had been working in [employer]  She is sometimes constipated.   She says it is worse since she started taking 2 iron every day.  Relevant past medical, surgical, family and social history reviewed and updated as indicated. Interim medical history since our last visit reviewed. Allergies and medications reviewed and updated.   Current Outpatient Medications:  .  ferrous sulfate 325 (65 FE) MG tablet, Take 650 mg by mouth  daily., Disp: , Rfl:  .  megestrol (MEGACE) 40 MG tablet, Take 3 tablets daily, Disp: 90 tablet, Rfl: 3   Review of Systems  Constitutional: Negative for appetite change, chills, diaphoresis, fatigue, fever and unexpected weight change.  HENT: Positive for dental problem. Negative for congestion, drooling, ear pain, facial swelling, hearing loss, mouth sores, sneezing, sore throat, trouble swallowing and voice change.   Eyes: Positive for pain, redness and itching. Negative for discharge and visual disturbance.  Respiratory: Negative for cough, choking, shortness of breath and wheezing.   Cardiovascular: Negative for chest pain, palpitations and leg swelling.  Gastrointestinal: Positive for abdominal pain, blood in stool and constipation. Negative for diarrhea and vomiting.  Endocrine: Negative for cold intolerance, heat intolerance and polydipsia.  Genitourinary: Negative for decreased urine volume, dysuria and hematuria.  Musculoskeletal: Positive for back pain. Negative for arthralgias and gait problem.  Skin: Negative for rash.  Allergic/Immunologic: Negative for environmental allergies.  Neurological: Negative for seizures, syncope, light-headedness and headaches.  Hematological: Negative for adenopathy.  Psychiatric/Behavioral: Negative for agitation, dysphoric mood and suicidal ideas. The patient is not nervous/anxious.     Per HPI unless  specifically indicated above     Objective:    BP 110/76 (BP Location: Left Arm, Patient Position: Sitting, Cuff Size: Normal)   Pulse 88   Temp 97.7 F (36.5 C) (Other (Comment))   Ht 5\' 1"  (1.549 m)   Wt 158 lb (71.7 kg)   LMP 08/01/2018 (Approximate)   SpO2 98%   BMI 29.85 kg/m   Wt Readings from Last 3 Encounters:  08/11/18 158 lb (71.7 kg)  07/14/18 155 lb (70.3 kg)  06/21/18 155 lb (70.3 kg)    Physical Exam Vitals signs reviewed.  Constitutional:      Appearance: She is well-developed.  HENT:     Head: Normocephalic and  atraumatic.  Neck:     Musculoskeletal: Neck supple.  Cardiovascular:     Rate and Rhythm: Normal rate and regular rhythm.  Pulmonary:     Effort: Pulmonary effort is normal.     Breath sounds: Normal breath sounds.  Abdominal:     General: Bowel sounds are normal.     Palpations: Abdomen is soft. There is no mass.     Tenderness: There is abdominal tenderness in the right lower quadrant and left lower quadrant. There is no guarding or rebound.     Comments: Mildly tender lower abd.  No rebound or guarding.   Musculoskeletal:     Left forearm: She exhibits no tenderness, no bony tenderness, no swelling and no edema.     Comments: There is some mild discomfort with pronating and supinating motions- discomfort in proximal forearm, anterior surface.  No swelling, tenderness to palpation or other abnormality  Lymphadenopathy:     Cervical: No cervical adenopathy.  Skin:    General: Skin is warm and dry.  Neurological:     Mental Status: She is alert and oriented to person, place, and time.  Psychiatric:        Behavior: Behavior normal.         Assessment & Plan:    Encounter Diagnoses  Name Primary?  Marland Kitchen Anemia, unspecified type Yes  . Abnormal menses   . Left forearm pain   . Constipation, unspecified constipation type   . Pelvic pain      -Check cbc in light of huge variance in POCT results from January.  May refer back to gyn after test results available in light of pt still having so much abnormal bleeding and pain. -Cut back to 1 iron table daily.  She can use some stool softener. Make sure to drink plenty of water. -counseled to use heat or ice on arm when needed for 10-20 minutes.  Could also use APAP or IBU prn -pt to follow up 3 months.  RTO sooner prn

## 2018-08-11 NOTE — Patient Instructions (Signed)
Stool softener

## 2018-09-06 ENCOUNTER — Ambulatory Visit: Payer: Self-pay

## 2018-09-20 ENCOUNTER — Ambulatory Visit: Payer: Self-pay | Admitting: Obstetrics & Gynecology

## 2018-10-13 ENCOUNTER — Ambulatory Visit: Payer: Self-pay | Admitting: Obstetrics and Gynecology

## 2018-11-03 ENCOUNTER — Encounter: Payer: Self-pay | Admitting: Physician Assistant

## 2018-11-03 ENCOUNTER — Ambulatory Visit: Payer: Self-pay | Admitting: Physician Assistant

## 2018-11-03 DIAGNOSIS — N926 Irregular menstruation, unspecified: Secondary | ICD-10-CM

## 2018-11-03 DIAGNOSIS — Z1239 Encounter for other screening for malignant neoplasm of breast: Secondary | ICD-10-CM

## 2018-11-03 NOTE — Progress Notes (Signed)
   There were no vitals taken for this visit.   Subjective:    Patient ID: Judy Mcdaniel, female    DOB: 1970-10-16, 48 y.o.   MRN: [id number]  HPI: Judy Mcdaniel is a 48 y.o. female presenting on 11/03/2018 for No chief complaint on file.   HPI    This is a telemedicine visit through [employer] due to coronavirus pandemic  I connected with  Judy Mcdaniel on 28/78/67 by a video enabled telemedicine application and verified that I am speaking with the correct person using two identifiers.   I discussed the limitations of evaluation and management by telemedicine. The patient expressed understanding and agreed to proceed.    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  Sometimes her hands feel numb but then it goes awa.  Sometimes her hip hurts.    She is working now at [employer] (fulltime)  Her menses  Have been light but it has been going for about 2 weeks.   Relevant past medical, surgical, family and social history reviewed and updated as indicated. Interim medical history since our last visit reviewed. Allergies and medications reviewed and updated.   Current Outpatient Medications:  .  megestrol (MEGACE) 40 MG tablet, Take 3 tablets daily, Disp: 90 tablet, Rfl: 3 .  ferrous sulfate 325 (65 FE) MG tablet, Take 650 mg by mouth daily., Disp: , Rfl:    Review of Systems  Per HPI unless specifically indicated above     Objective:    There were no vitals taken for this visit.  Wt Readings from Last 3 Encounters:  08/11/18 158 lb (71.7 kg)  07/14/18 155 lb (70.3 kg)  06/21/18 155 lb (70.3 kg)    Physical Exam Constitutional:      General: She is not in acute distress.    Appearance: Normal appearance. She is obese. She is not ill-appearing.  HENT:     Head: Normocephalic and atraumatic.  Pulmonary:     Effort: Pulmonary effort is normal. No respiratory distress.  Neurological:     Mental Status: She is alert and  oriented to person, place, and time.  Psychiatric:        Attention and Perception: Attention normal.        Mood and Affect: Mood normal.        Speech: Speech normal.        Behavior: Behavior is cooperative.        Cognition and Memory: Cognition normal.          Assessment & Plan:    Encounter Diagnoses  Name Primary?  . Abnormal menses Yes  . Screening for breast cancer     -will order Screening mammogram  -Discussed sending pt back to gyn but she doesn't want surgery so will just keep her 1 year follow up with gyn (due January)  -Pt encouraged to get back on iron pill every other day  -pt encouraged to wear face covering/mask when out in public per CDC guidelines  -pt to follow up 4 months.  Pt to contact office sooner prn

## 2018-11-09 ENCOUNTER — Ambulatory Visit: Payer: Self-pay | Admitting: Physician Assistant

## 2018-11-11 ENCOUNTER — Other Ambulatory Visit: Payer: Self-pay | Admitting: Physician Assistant

## 2018-11-11 ENCOUNTER — Ambulatory Visit: Payer: Self-pay | Admitting: Physician Assistant

## 2018-11-11 DIAGNOSIS — Z1239 Encounter for other screening for malignant neoplasm of breast: Secondary | ICD-10-CM

## 2018-11-29 ENCOUNTER — Ambulatory Visit (HOSPITAL_COMMUNITY): Payer: Self-pay

## 2018-11-29 ENCOUNTER — Ambulatory Visit (HOSPITAL_COMMUNITY)
Admission: RE | Admit: 2018-11-29 | Discharge: 2018-11-29 | Disposition: A | Discharge status: Home / Self Care | Payer: Self-pay | Source: Ambulatory Visit | Attending: Physician Assistant | Admitting: Physician Assistant

## 2018-11-29 ENCOUNTER — Other Ambulatory Visit: Payer: Self-pay

## 2018-11-29 DIAGNOSIS — Z1239 Encounter for other screening for malignant neoplasm of breast: Secondary | ICD-10-CM | POA: Insufficient documentation

## 2019-03-14 ENCOUNTER — Ambulatory Visit: Payer: Self-pay | Admitting: Physician Assistant

## 2019-03-23 ENCOUNTER — Ambulatory Visit: Payer: Self-pay | Admitting: Physician Assistant

## 2019-04-13 ENCOUNTER — Ambulatory Visit: Payer: Self-pay | Admitting: Physician Assistant

## 2019-08-09 ENCOUNTER — Encounter: Payer: Self-pay | Admitting: Physician Assistant

## 2019-08-09 ENCOUNTER — Ambulatory Visit: Payer: Self-pay | Admitting: Physician Assistant

## 2019-08-09 DIAGNOSIS — N926 Irregular menstruation, unspecified: Secondary | ICD-10-CM

## 2019-08-09 DIAGNOSIS — Z862 Personal history of diseases of the blood and blood-forming organs and certain disorders involving the immune mechanism: Secondary | ICD-10-CM

## 2019-08-09 DIAGNOSIS — Z789 Other specified health status: Secondary | ICD-10-CM

## 2019-08-09 DIAGNOSIS — Z8639 Personal history of other endocrine, nutritional and metabolic disease: Secondary | ICD-10-CM

## 2019-08-09 NOTE — Progress Notes (Signed)
   There were no vitals taken for this visit.   Subjective:    Patient ID: Judy Mcdaniel, female    DOB: November 18, 1970, 49 y.o.   MRN: [id number]  HPI: Judy Mcdaniel is a 49 y.o. female presenting on 08/09/2019 for No chief complaint on file.   HPI  This is a telemedicine appointment through [employer]ox due to coronavirus pandemic.  I connected with  Judy Mcdaniel on [id number] by a video enabled telemedicine application and verified that I am speaking with the correct person using two identifiers.   I discussed the limitations of evaluation and management by telemedicine. The patient expressed understanding and agreed to proceed.  Pt is at home.  Translator is at office. 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    Pt is 37yoF with his menorrhagia and anemia.  She takes megace sometimes.  She is not taking the iron.  She only gets menses some months and not every month.  She feels well.  She is still working at [employer]  She is not exercising.  She says her weight is probably gained some.  She is wearing a mask when she goes out to reduce risk of covid 19.    Relevant past medical, surgical, family and social history reviewed and updated as indicated. Interim medical history since our last visit reviewed. Allergies and medications reviewed and updated.  CURRENT MEDS: none  Review of Systems  Per HPI unless specifically indicated above     Objective:    There were no vitals taken for this visit.  Wt Readings from Last 3 Encounters:  08/11/18 158 lb (71.7 kg)  07/14/18 155 lb (70.3 kg)  06/21/18 155 lb (70.3 kg)    Physical Exam Constitutional:      General: She is not in acute distress.    Appearance: Normal appearance. She is not ill-appearing.  HENT:     Head: Normocephalic and atraumatic.  Pulmonary:     Effort: Pulmonary effort is normal. No respiratory distress.  Neurological:     Mental Status: She is alert and oriented to person, place, and time.   Psychiatric:        Attention and Perception: Attention normal.        Mood and Affect: Mood normal.        Speech: Speech normal.        Behavior: Behavior normal. Behavior is cooperative.         Assessment & Plan:   Encounter Diagnoses  Name Primary?  . History of hyperlipidemia Yes  . Abnormal menses   . History of anemia   . Not proficient in English language       -will check liipds, hg -Pt to follow up with PAP in 1 mnths -F/u in office 1 mo to update PAP.  Pt to contact office sooner prn

## 2019-08-18 ENCOUNTER — Other Ambulatory Visit (HOSPITAL_COMMUNITY)
Admission: RE | Admit: 2019-08-18 | Discharge: 2019-08-18 | Disposition: A | Discharge status: Home / Self Care | Payer: Self-pay | Source: Ambulatory Visit | Attending: Physician Assistant | Admitting: Physician Assistant

## 2019-08-18 DIAGNOSIS — Z8639 Personal history of other endocrine, nutritional and metabolic disease: Secondary | ICD-10-CM | POA: Insufficient documentation

## 2019-08-18 DIAGNOSIS — Z862 Personal history of diseases of the blood and blood-forming organs and certain disorders involving the immune mechanism: Secondary | ICD-10-CM | POA: Insufficient documentation

## 2019-08-18 DIAGNOSIS — N926 Irregular menstruation, unspecified: Secondary | ICD-10-CM | POA: Insufficient documentation

## 2019-08-18 LAB — COMPREHENSIVE METABOLIC PANEL
ALT: 19 U/L (ref 0–44)
AST: 20 U/L (ref 15–41)
Albumin: 4 g/dL (ref 3.5–5.0)
Alkaline Phosphatase: 98 U/L (ref 38–126)
Anion gap: 7 (ref 5–15)
BUN: 18 mg/dL (ref 6–20)
CO2: 27 mmol/L (ref 22–32)
Calcium: 8.9 mg/dL (ref 8.9–10.3)
Chloride: 103 mmol/L (ref 98–111)
Creatinine, Ser: 0.59 mg/dL (ref 0.44–1.00)
GFR calc Af Amer: >60 mL/min (ref >60)
GFR calc non Af Amer: >60 mL/min (ref >60)
Glucose, Bld: 89 mg/dL (ref 70–99)
Potassium: 4.3 mmol/L (ref 3.5–5.1)
Sodium: 137 mmol/L (ref 135–145)
Total Bilirubin: 1.1 mg/dL (ref 0.3–1.2)
Total Protein: 6.8 g/dL (ref 6.5–8.1)

## 2019-08-18 LAB — LIPID PANEL
Cholesterol: 186 mg/dL (ref 0–200)
HDL: 62 mg/dL (ref >40)
LDL Cholesterol: 106 mg/dL — ABNORMAL HIGH (ref 0–99)
Total CHOL/HDL Ratio: 3 RATIO
Triglycerides: 89 mg/dL (ref <150)
VLDL: 18 mg/dL (ref 0–40)

## 2019-08-18 LAB — HEMOGLOBIN AND HEMATOCRIT, BLOOD
HCT: 43.4 % (ref 36.0–46.0)
Hemoglobin: 13.7 g/dL (ref 12.0–15.0)

## 2019-09-05 ENCOUNTER — Encounter: Payer: Self-pay | Admitting: Physician Assistant

## 2019-09-05 ENCOUNTER — Other Ambulatory Visit (HOSPITAL_COMMUNITY)
Admission: RE | Admit: 2019-09-05 | Discharge: 2019-09-05 | Disposition: A | Discharge status: Home / Self Care | Payer: Self-pay | Source: Ambulatory Visit | Attending: Physician Assistant | Admitting: Physician Assistant

## 2019-09-05 ENCOUNTER — Other Ambulatory Visit: Payer: Self-pay

## 2019-09-05 ENCOUNTER — Ambulatory Visit: Payer: Self-pay | Admitting: Physician Assistant

## 2019-09-05 VITALS — BP 130/80 | HR 77 | Temp 98.1°F | Wt 148.6 lb

## 2019-09-05 DIAGNOSIS — Z8639 Personal history of other endocrine, nutritional and metabolic disease: Secondary | ICD-10-CM

## 2019-09-05 DIAGNOSIS — Z789 Other specified health status: Secondary | ICD-10-CM

## 2019-09-05 DIAGNOSIS — Z124 Encounter for screening for malignant neoplasm of cervix: Secondary | ICD-10-CM

## 2019-09-05 DIAGNOSIS — M545 Low back pain, unspecified: Secondary | ICD-10-CM

## 2019-09-05 DIAGNOSIS — Z862 Personal history of diseases of the blood and blood-forming organs and certain disorders involving the immune mechanism: Secondary | ICD-10-CM

## 2019-09-05 MED ORDER — NAPROXEN 500 MG PO TABS: ORAL_TABLET | ORAL | 3 refills | Status: DC

## 2019-09-05 NOTE — Patient Instructions (Signed)
Ejercicios para la Higher education careers adviser Exercises Los siguientes ejercicios fortalecen los msculos que dan soporte al tronco y a la espalda. Adems, ayudan a mantener la flexibilidad de la zona lumbar. Hacer estos ejercicios puede ser de ayuda para evitar o Best boy de espalda.  Si tiene dolor o [employer] espalda, intente hacer estos ejercicios 2 o 3veces por da, o como se lo haya indicado el mdico.  A medida que el dolor desaparece, hgalos una vez por da, pero aumente la cantidad de veces que repite los pasos para cada ejercicio (haga ms repeticiones).  Para prevenir la recurrencia del dolor de espalda, contine haciendo estos ejercicios una vez al da o como se lo haya indicado el mdico. Haga los ejercicios exactamente como se lo haya indicado el mdico y gradelos como se lo hayan indicado. Es normal sentir un leve estiramiento, tirn, rigidez o molestia cuando haga estos ejercicios, pero debe detenerse de inmediato si siente un dolor repentino o si el dolor empeora. Ejercicios Rodilla al pecho Repita estos pasos 3 o 5veces con cada pierna: 1. Acustese boca arriba sobre una cama dura o sobre el suelo con las piernas extendidas. 2. Lleve una rodilla al pecho. La otra pierna debe quedar extendida y en contacto con el suelo. 3. [place]. Para lograrlo tmese la rodilla o el muslo con ambas manos y sostenga. 4. Tire de la rodilla hasta sentir una elongacin suave en la parte baja de la espalda o las nalgas. 5. Mantenga la elongacin durante 10 a 30segundos. 6. Suelte y extienda la pierna lentamente. Inclinacin de la pelvis Repita estos pasos 5 o 10veces: 1. Acustese boca arriba sobre una cama dura o sobre el suelo con las piernas extendidas. 2. Flexione las rodillas de modo que apunten al techo y los pies queden apoyados en el suelo. 3. Contraiga los msculos de la parte baja del abdomen para empujar la zona lumbar contra el suelo. Con este  movimiento se inclinar la pelvis de modo que el coxis apunte hacia el techo, en lugar de apuntar a los pies o al suelo. 4. Contraiga suavemente y respire con normalidad mientras mantiene esta posicin durante 5 a 10segundos. El perro y el gato Repita estos pasos hasta que la zona lumbar se vuelva ms flexible: 1. [place] manos y las rodillas sobre una superficie firme. Las manos deben estar alineadas con los hombros y las rodillas con las caderas. Puede colocarse almohadillas debajo de las rodillas para estar cmodo. 2. Deje que la cabeza cuelgue hacia el pecho. Contraiga los msculos abdominales y baje el coxis en direccin al suelo de modo que la zona lumbar se arquee como el lomo de un [place]. 3. Mantenga esta posicin durante 5segundos. 4. Lentamente, levante la cabeza, relaje los msculos abdominales y eleve el coxis de modo que apunte en direccin al techo para que la espalda forme un arco hundido como el lomo de un perro contento. 5. Mantenga esta posicin durante 5segundos.  Flexiones de brazos Repita estos pasos 5 o 10veces: 1. Acustese sobre el abdomen (boca abajo) en el piso. [place] manos cerca de la cabeza, separadas aproximadamente al ancho de los hombros. 3. Con la espalda lo ms relajada posible y las caderas apoyadas en el suelo, extienda lentamente los brazos para levantar la mitad superior del cuerpo y Community education officer los hombros. No use los msculos de la espalda para elevar la parte superior del torso. Puede  cambiar las manos de lugar para estar ms cmodo. 4. Mantenga esta posicin durante 5segundos mientras mantiene la espalda relajada. 5. Lentamente vuelva a la posicin horizontal.  Puentes Repita estos pasos 10veces: 1. Acustese boca arriba sobre una superficie firme. 2. Flexione las rodillas de modo que apunten al techo y los pies queden apoyados en el suelo. Los brazos deben estar planos a los costados del cuerpo, junto al  cuerpo. 3. Contraiga los glteos y despegue las nalgas del suelo hasta que la cintura est casi a la misma altura que las rodillas. Debe sentir el trabajo muscular en las nalgas y la parte de atrs de los muslos. Si no siente el esfuerzo de [employer], aleje los pies 1 o 2pulgadas (2.5 o 5centmetros) de las nalgas. 4. Mantenga esta posicin durante 3 a 5segundos. 5. Baje lentamente las caderas a la posicin inicial y relaje los glteos por completo. Si este ejercicio le resulta muy fcil, intente realizarlo con los brazos cruzados [place]. Abdominales Repita estos pasos 5 o 10veces: 1. Acustese boca arriba sobre una cama dura o sobre el suelo con las piernas extendidas. 2. Flexione las rodillas de modo que apunten al techo y los pies queden apoyados en el suelo. 3. Cruce los [employer]. 4. Baje levemente el mentn en direccin al pecho sin doblar el cuello. 5. Contraiga los msculos abdominales y con lentitud eleve el tronco (torso) lo suficiente como para Administrator los omplatos del suelo. No eleve el torso ms que eso, porque esto puede sobreexigir a la zona lumbar y no ayuda a Aeronautical engineer abdominales. 6. Regrese lentamente a la posicin inicial. Elevaciones de espalda Repita estos pasos 5 o 10veces: 1. Acustese sobre el abdomen (boca abajo) con los brazos a los costados del cuerpo y apoye la frente en el suelo. 2. Contraiga los msculos de las piernas y las nalgas. 3. Lentamente despegue el pecho del suelo [employer] las caderas bien apoyadas en el suelo. Mantenga la nuca alineada con la curvatura de la espalda. Los ojos deben mirar al suelo. 4. Mantenga esta posicin durante 3 a 5segundos. 5. Regrese lentamente a la posicin inicial. Comunquese con un mdico si:  El dolor o las molestias en la espalda se vuelven mucho ms intensos cuando hace un ejercicio.  El dolor o las molestias en la espalda que [place], no se Runner, broadcasting/film/video en el  trmino de las 2horas posteriores a Therapist, art. Si tiene alguno de [employer], deje de [employer] ejercicios de inmediato. No vuelva a hacer los ejercicios a menos que el mdico lo autorice. [place] ayuda inmediatamente si:  Siente un dolor sbito e intenso en la espalda. Si esto ocurre, deje de [employer] ejercicios de inmediato. No vuelva a hacer los ejercicios a menos que el mdico lo autorice. Esta informacin no tiene Marine scientist el consejo del mdico. Asegrese de hacerle al mdico cualquier pregunta que tenga. Document Revised: 04/15/2018 Document Reviewed: 04/15/2018 Elsevier Patient Education  2020 [employer].

## 2019-09-05 NOTE — Progress Notes (Signed)
BP 130/80   Pulse 77   Temp 98.1 F (36.7 C)   Wt 148 lb 9.6 oz (67.4 kg)   LMP 08/21/2019 (Approximate)   SpO2 98%   BMI 28.08 kg/m    Subjective:    Patient ID: Judy Mcdaniel, female    DOB: 11-27-1970, 49 y.o.   MRN: [id number]  HPI: Kaliyan Rementer is a 49 y.o. female presenting on 09/05/2019 for Gynecologic Exam   HPI    Pt had a negative covid 19 screening questionnaire.     Pt presents for follow up and PAP today.  She is doing well.  She sometimes has Pain lower back sometimes goes into R thigh.  No numbness or weakness.  Pt stands at work all day Surveyor, mining).     Relevant past medical, surgical, family and social history reviewed and updated as indicated. Interim medical history since our last visit reviewed. Allergies and medications reviewed and updated.   Current Outpatient Medications:  .  ferrous sulfate 325 (65 FE) MG tablet, Take 650 mg by mouth daily., Disp: , Rfl:  .  megestrol (MEGACE) 40 MG tablet, Take 3 tablets daily (Patient not taking: Reported on 08/09/2019), Disp: 90 tablet, Rfl: 3   Review of Systems  Per HPI unless specifically indicated above     Objective:    BP 130/80   Pulse 77   Temp 98.1 F (36.7 C)   Wt 148 lb 9.6 oz (67.4 kg)   LMP 08/21/2019 (Approximate)   SpO2 98%   BMI 28.08 kg/m   Wt Readings from Last 3 Encounters:  09/05/19 148 lb 9.6 oz (67.4 kg)  08/11/18 158 lb (71.7 kg)  07/14/18 155 lb (70.3 kg)    Physical Exam Vitals and nursing note reviewed.  Constitutional:      Appearance: She is well-developed.  HENT:     Head: Normocephalic and atraumatic.  Cardiovascular:     Rate and Rhythm: Normal rate and regular rhythm.  Pulmonary:     Effort: Pulmonary effort is normal.     Breath sounds: Normal breath sounds.  Abdominal:     General: Bowel sounds are normal.     Palpations: Abdomen is soft. There is no mass.     Tenderness: There is no abdominal tenderness. There is no guarding or  rebound.  Genitourinary:    Labia:        Right: No rash, tenderness or lesion.        Left: No rash, tenderness or lesion.      Vagina: Normal.     Cervix: No cervical motion tenderness, discharge or friability.     Adnexa:        Right: No mass, tenderness or fullness.         Left: No mass, tenderness or fullness.       Comments: (nurse Berenice assisted) Musculoskeletal:     Cervical back: Neck supple.     Right lower leg: No edema.     Left lower leg: No edema.  Lymphadenopathy:     Cervical: No cervical adenopathy.  Skin:    General: Skin is warm and dry.  Neurological:     Mental Status: She is alert and oriented to person, place, and time.  Psychiatric:        Attention and Perception: Attention normal.        Mood and Affect: Mood normal.        Speech: Speech normal.  Behavior: Behavior normal. Behavior is cooperative.     Results for orders placed or performed during the hospital encounter of 08/18/19  Hemoglobin and hematocrit, blood  Result Value Ref Range   Hemoglobin 13.7 12.0 - 15.0 g/dL   HCT 43.4 36.0 - 46.0 %  Comprehensive metabolic panel  Result Value Ref Range   Sodium 137 135 - 145 mmol/L   Potassium 4.3 3.5 - 5.1 mmol/L   Chloride 103 98 - 111 mmol/L   CO2 27 22 - 32 mmol/L   Glucose, Bld 89 70 - 99 mg/dL   BUN 18 6 - 20 mg/dL   Creatinine, Ser 0.59 0.44 - 1.00 mg/dL   Calcium 8.9 8.9 - 10.3 mg/dL   Total Protein 6.8 6.5 - 8.1 g/dL   Albumin 4.0 3.5 - 5.0 g/dL   AST 20 15 - 41 U/L   ALT 19 0 - 44 U/L   Alkaline Phosphatase 98 38 - 126 U/L   Total Bilirubin 1.1 0.3 - 1.2 mg/dL   GFR calc non Af Amer >60 >60 mL/min   GFR calc Af Amer >60 >60 mL/min   Anion gap 7 5 - 15  Lipid panel  Result Value Ref Range   Cholesterol 186 0 - 200 mg/dL   Triglycerides 89 <150 mg/dL   HDL 62 >40 mg/dL   Total CHOL/HDL Ratio 3.0 RATIO   VLDL 18 0 - 40 mg/dL   LDL Cholesterol 106 (H) 0 - 99 mg/dL      Assessment & Plan:    Encounter Diagnoses   Name Primary?  . Routine Papanicolaou smear Yes  . History of anemia   . History of hyperlipidemia   . Not proficient in [place] language   . Low back pain without sciatica, unspecified back pain laterality, unspecified chronicity      -reviewed labs with pt -pt will be referred for screening mammogram in June -pt is signed up for covd vaccination -recommended  back exercises for strengthening and increased flexibility.  Use ice/heat to area and naproxen when needed.  Recommended good supportive shoes and to stand on a mat (and not hard floor) -pt to follow up  1 year.  She is to RTO sooner if needed

## 2019-09-07 LAB — CYTOLOGY - PAP
Comment: NEGATIVE
Diagnosis: NEGATIVE
High risk HPV: NEGATIVE

## 2019-12-08 ENCOUNTER — Other Ambulatory Visit: Payer: Self-pay | Admitting: Student

## 2019-12-08 DIAGNOSIS — Z1231 Encounter for screening mammogram for malignant neoplasm of breast: Secondary | ICD-10-CM

## 2019-12-26 ENCOUNTER — Other Ambulatory Visit: Payer: Self-pay

## 2019-12-26 ENCOUNTER — Ambulatory Visit (HOSPITAL_COMMUNITY)
Admission: RE | Admit: 2019-12-26 | Discharge: 2019-12-26 | Disposition: A | Discharge status: Home / Self Care | Payer: Self-pay | Source: Ambulatory Visit | Attending: Physician Assistant | Admitting: Physician Assistant

## 2019-12-26 DIAGNOSIS — Z1231 Encounter for screening mammogram for malignant neoplasm of breast: Secondary | ICD-10-CM | POA: Insufficient documentation

## 2020-05-24 ENCOUNTER — Ambulatory Visit: Payer: Self-pay | Admitting: Physician Assistant

## 2020-05-24 ENCOUNTER — Other Ambulatory Visit: Payer: Self-pay

## 2020-05-24 ENCOUNTER — Ambulatory Visit (HOSPITAL_COMMUNITY)
Admission: RE | Admit: 2020-05-24 | Discharge: 2020-05-24 | Disposition: A | Discharge status: Home / Self Care | Payer: Self-pay | Source: Ambulatory Visit | Attending: Physician Assistant | Admitting: Physician Assistant

## 2020-05-24 ENCOUNTER — Encounter: Payer: Self-pay | Admitting: Physician Assistant

## 2020-05-24 VITALS — BP 116/70 | HR 64 | Temp 94.5°F | Ht 60.0 in | Wt 142.0 lb

## 2020-05-24 DIAGNOSIS — R300 Dysuria: Secondary | ICD-10-CM

## 2020-05-24 DIAGNOSIS — R3915 Urgency of urination: Secondary | ICD-10-CM

## 2020-05-24 DIAGNOSIS — M79642 Pain in left hand: Secondary | ICD-10-CM

## 2020-05-24 DIAGNOSIS — M79641 Pain in right hand: Secondary | ICD-10-CM | POA: Insufficient documentation

## 2020-05-24 DIAGNOSIS — R350 Frequency of micturition: Secondary | ICD-10-CM

## 2020-05-24 DIAGNOSIS — M19041 Primary osteoarthritis, right hand: Secondary | ICD-10-CM

## 2020-05-24 DIAGNOSIS — Z789 Other specified health status: Secondary | ICD-10-CM

## 2020-05-24 LAB — POCT URINALYSIS DIPSTICK
Bilirubin, UA: NEGATIVE
Glucose, UA: NEGATIVE
Ketones, UA: NEGATIVE
Nitrite, UA: NEGATIVE
Protein, UA: NEGATIVE
Spec Grav, UA: <=1.005 — AB (ref 1.010–1.025)
Urobilinogen, UA: 0.2 E.U./dL
pH, UA: 7 (ref 5.0–8.0)

## 2020-05-24 MED ORDER — SULFAMETHOXAZOLE-TRIMETHOPRIM 800-160 MG PO TABS: ORAL_TABLET | ORAL | 0 refills | Status: DC

## 2020-05-24 NOTE — Progress Notes (Signed)
BP 116/70   Pulse 64   Temp (!) 94.5 F (34.7 C)   SpO2 97%    Subjective:    Patient ID: Judy Mcdaniel, female    DOB: [birth year], 49 y.o.   MRN: [id number]  HPI: Judy Mcdaniel is a 50 y.o. female presenting on 05/24/2020 for Urinary Frequency (Pt has urinary urge and feels she does not void completely. Pt has pain and burning when urinating.  Pt sx started 3-4 days ago. ) and Hand Pain (Pt c/o pain on all fingers. More on R pinky. Pt states her R pinky cramps and is unable to straighten on its own and has to use her other hand to straighten out which hurts to do. Pt is concerned with arthritis. Pt has taken Aleve arthritis OTC which does not help./)   HPI    Pt had a negative covid 19 screening questionnaire.   Chief Complaint  Patient presents with  . Urinary Frequency    Pt has urinary urge and feels she does not void completely. Pt has pain and burning when urinating.  Pt sx started 3-4 days ago.   . Hand Pain    Pt c/o pain on all fingers. More on R pinky. Pt states her R pinky cramps and is unable to straighten on its own and has to use her other hand to straighten out which hurts to do. Pt is concerned with arthritis. Pt has taken Aleve arthritis OTC which does not help.      No fevers   She works at [employer] doing [employer].  She started there about a month ago but her hand burt before she started working.  The hanged hurt for about 6 months or maybe longer.  Her fingers always hurt and the R pinky is worst.  She is R hand dominant.    Not yet vaccinated for covid.    Relevant past medical, surgical, family and social history reviewed and updated as indicated. Interim medical history since our last visit reviewed. Allergies and medications reviewed and updated.  Review of Systems  Per HPI unless specifically indicated above     Objective:    BP 116/70   Pulse 64   Temp (!) 94.5 F (34.7 C)   SpO2 97%   Wt Readings from Last 3 Encounters:  09/05/19 148  lb 9.6 oz (67.4 kg)  08/11/18 158 lb (71.7 kg)  07/14/18 155 lb (70.3 kg)    Physical Exam Vitals reviewed.  Constitutional:      Appearance: She is well-developed and well-nourished.  HENT:     Head: Normocephalic and atraumatic.  Cardiovascular:     Rate and Rhythm: Normal rate and regular rhythm.  Pulmonary:     Effort: Pulmonary effort is normal.     Breath sounds: Normal breath sounds.  Abdominal:     General: Bowel sounds are normal.     Palpations: Abdomen is soft. There is no hepatosplenomegaly or mass.     Tenderness: There is no abdominal tenderness. There is no right CVA tenderness or left CVA tenderness.  Musculoskeletal:        General: No edema.     Right hand: Normal pulse.     Left hand: Normal pulse.     Cervical back: Neck supple.     Comments: Nodules, particularly on R hand fingers, at DIP joint.    Lymphadenopathy:     Cervical: No cervical adenopathy.  Skin:    General: Skin is warm and dry.  Neurological:     Mental Status: She is alert and oriented to person, place, and time.  Psychiatric:        Mood and Affect: Mood and affect normal.        Behavior: Behavior normal.            Assessment & Plan:     Encounter Diagnoses  Name Primary?  . Urinary urgency Yes  . Urinary frequency   . Dysuria   . Pain in both hands   . Osteoarthritis of both hands, unspecified osteoarthritis type   . Not proficient in English language         -Septra ds for urine.  Encouraged to drink plenty water and avoid sodas  -Counseled on covid vaccination  -will get Xray right hand  -pt is given cafa (for xray)  -Use apap 1 tab with ibu 1 tab together prn pain.   Ice/heat.

## 2020-09-04 ENCOUNTER — Ambulatory Visit: Payer: Self-pay | Admitting: Physician Assistant

## 2020-09-04 ENCOUNTER — Encounter: Payer: Self-pay | Admitting: Physician Assistant

## 2020-09-04 DIAGNOSIS — D219 Benign neoplasm of connective and other soft tissue, unspecified: Secondary | ICD-10-CM

## 2020-09-04 DIAGNOSIS — Z789 Other specified health status: Secondary | ICD-10-CM

## 2020-09-04 DIAGNOSIS — N926 Irregular menstruation, unspecified: Secondary | ICD-10-CM

## 2020-09-04 NOTE — Progress Notes (Signed)
   There were no vitals taken for this visit.   Subjective:    Patient ID: Judy Mcdaniel, female    DOB: [birth year], 50 y.o.   MRN: [id number]  HPI: Judy Mcdaniel is a 50 y.o. female presenting on 09/04/2020 for No chief complaint on file.   HPI  This is a telemedicine appointment due to coronavirus pandemic.  It is via Telephone as pt was having difficulty connecting through [employer].  I connected with  Judy Mcdaniel on 44/03/47 by a video enabled telemedicine application and verified that I am speaking with the correct person using two identifiers.   I discussed the limitations of evaluation and management by telemedicine. The patient expressed understanding and agreed to proceed.  Pt is at home.  Provider is at office.    Pt is 49yoF with routine follow up.  Pt is requesting something for menopause.  LMP  December or January.   She feels hot flashes.  And she feels tired.    She is working at [employer] place.    She got first 2 doses  covid vaccination.     She is doing well except for the hot flashes.  Pelvic US 2018.  It showed fibroids, no endometrial thickening.  [place] ordered for 2019 for fibroids and menorrhagia but was never done.        Relevant past medical, surgical, family and social history reviewed and updated as indicated. Interim medical history since our last visit reviewed. Allergies and medications reviewed and updated.  No current outpatient medications on file.     Review of Systems  Per HPI unless specifically indicated above     Objective:    There were no vitals taken for this visit.  Wt Readings from Last 3 Encounters:  05/24/20 142 lb (64.4 kg)  09/05/19 148 lb 9.6 oz (67.4 kg)  08/11/18 158 lb (71.7 kg)    Physical Exam Pulmonary:     Effort: No respiratory distress.     Comments: Pt is speaking in complete sentences without sob Neurological:     Mental Status: She is alert and oriented to person, place, and time.   Psychiatric:        Attention and Perception: Attention normal.        Speech: Speech normal.        Behavior: Behavior is cooperative.                Assessment & Plan:   Encounter Diagnoses  Name Primary?  . Not proficient in [place] language Yes  . Fibroids   . Irregular menses       -will Update [place] and check fsh then poss HRT -an application for [employer] financial assistance is put in the mail for the pt -Pt is encouraged to bring her covid card with her to next appointment so it can be documented -pt to F/u 1 month.  She is to contact office sooner prn

## 2020-09-17 ENCOUNTER — Other Ambulatory Visit: Payer: Self-pay

## 2020-09-17 ENCOUNTER — Other Ambulatory Visit (HOSPITAL_COMMUNITY)
Admission: RE | Admit: 2020-09-17 | Discharge: 2020-09-17 | Disposition: A | Discharge status: Home / Self Care | Payer: Self-pay | Source: Ambulatory Visit | Attending: Physician Assistant | Admitting: Physician Assistant

## 2020-09-17 ENCOUNTER — Ambulatory Visit (HOSPITAL_COMMUNITY)
Admission: RE | Admit: 2020-09-17 | Discharge: 2020-09-17 | Disposition: A | Discharge status: Home / Self Care | Payer: Self-pay | Source: Ambulatory Visit | Attending: Physician Assistant | Admitting: Physician Assistant

## 2020-09-17 DIAGNOSIS — N926 Irregular menstruation, unspecified: Secondary | ICD-10-CM | POA: Insufficient documentation

## 2020-09-17 DIAGNOSIS — D219 Benign neoplasm of connective and other soft tissue, unspecified: Secondary | ICD-10-CM | POA: Insufficient documentation

## 2020-09-18 LAB — FSH/LH
FSH: 45.6 m[IU]/mL
LH: 32.2 m[IU]/mL

## 2020-09-27 ENCOUNTER — Other Ambulatory Visit: Payer: Self-pay | Admitting: Physician Assistant

## 2020-09-27 ENCOUNTER — Encounter: Payer: Self-pay | Admitting: Physician Assistant

## 2020-09-27 ENCOUNTER — Other Ambulatory Visit: Payer: Self-pay

## 2020-09-27 ENCOUNTER — Ambulatory Visit: Payer: Self-pay | Admitting: Physician Assistant

## 2020-09-27 VITALS — BP 122/80 | HR 66 | Temp 97.2°F | Wt 136.0 lb

## 2020-09-27 DIAGNOSIS — N951 Menopausal and female climacteric states: Secondary | ICD-10-CM

## 2020-09-27 DIAGNOSIS — Z1211 Encounter for screening for malignant neoplasm of colon: Secondary | ICD-10-CM

## 2020-09-27 DIAGNOSIS — H6123 Impacted cerumen, bilateral: Secondary | ICD-10-CM

## 2020-09-27 DIAGNOSIS — Z789 Other specified health status: Secondary | ICD-10-CM

## 2020-09-27 DIAGNOSIS — D219 Benign neoplasm of connective and other soft tissue, unspecified: Secondary | ICD-10-CM

## 2020-09-27 NOTE — Progress Notes (Signed)
BP 122/80   Pulse 66   Temp (!) 97.2 F (36.2 C)   Wt 136 lb (61.7 kg)   BMI 26.56 kg/m    Subjective:    Patient ID: Judy Mcdaniel, female    DOB: 17-Sep-1970, 50 y.o.   MRN: [id number]  HPI: Judy Mcdaniel is a 50 y.o. female presenting on 09/27/2020 for No chief complaint on file.   HPI  Pt had a negative covid 19 screenign questionnaire.   Pt is 59yoF who presents to follow up after she had a pelvic US and fsh test.  She thinks her LMP  Was in December or January.  Her menses Had been irregular prior to that.   She says Hot flashes started about 6 weeks ago but it is less now.    She also complains of wanting her ears checked because they are bothering her, she thinks because she has to wear earplugs at work.     Relevant past medical, surgical, family and social history reviewed and updated as indicated. Interim medical history since our last visit reviewed. Allergies and medications reviewed and updated.  No current outpatient medications on file.     Review of Systems  Per HPI unless specifically indicated above     Objective:    BP 122/80   Pulse 66   Temp (!) 97.2 F (36.2 C)   Wt 136 lb (61.7 kg)   BMI 26.56 kg/m   Wt Readings from Last 3 Encounters:  09/27/20 136 lb (61.7 kg)  05/24/20 142 lb (64.4 kg)  09/05/19 148 lb 9.6 oz (67.4 kg)    Physical Exam Constitutional:      General: She is not in acute distress.    Appearance: She is not ill-appearing or toxic-appearing.  HENT:     Head: Normocephalic and atraumatic.     Right Ear: There is impacted cerumen.     Left Ear: There is impacted cerumen.     Ears:     Comments: After lavage, TMs clear bilaterally Pulmonary:     Effort: Pulmonary effort is normal. No respiratory distress.  Skin:    General: Skin is warm and dry.  Neurological:     Mental Status: She is alert and oriented to person, place, and time.     Gait: Gait normal.  Psychiatric:        Attention and Perception:  Attention normal.        Mood and Affect: Mood normal.        Speech: Speech normal.        Behavior: Behavior normal. Behavior is cooperative.     Results for orders placed or performed during the hospital encounter of 09/17/20  FSH/LH  Result Value Ref Range   LH 32.2 mIU/mL   FSH 45.6 mIU/mL      Assessment & Plan:    Encounter Diagnoses  Name Primary?  . Impacted cerumen, bilateral Yes  . Not proficient in [place] language   . Fibroids   . Hot flushes, perimenopausal   . Screening for colon cancer     -Reviewed [place] and labs with pt -will refer for screening mammogram mid July -Pt is given another cafa/cone charity financial assistance application -pt was Given fecal test for colon cancer screening -discussed HRT which is expensive, > $200/month, and need to wait until no menses for 12 months.  She is counseled on soy diet which can help with symptoms.  She is given reading information on menopause and soy  diet -pt will be scheduled to follow up early December.  She is to contact office sooner prn

## 2020-09-27 NOTE — Patient Instructions (Signed)
Menopausia y terapia de reemplazo hormonal Menopause and Hormone Replacement Therapy La menopausia es la etapa normal de la vida cuando los perodos menstruales desaparecen y los ovarios dejan de producir las hormonas femeninas estrgeno y Immunologist. El nivel bajo de esas hormonas puede afectar la salud y causar sntomas. La terapia de reemplazo hormonal (TRH) puede aliviar algunos de esos sntomas. La terapia de reemplazo hormonal (TRH) es el uso de hormonas artificiales (sintticas) para Training and development officer las hormonas que el cuerpo deja de producir al llegar a la menopausia. Tipos de TRH La TRH puede consistir de hormonas sintticas de estrgeno y Immunologist o puede consistir solamente de Oceanographer. Usted y su mdico decidirn qu tipo de TRH es la ms [place] para usted. Si elige Lucilla Edin TRH y tiene tero, generalmente se prescribe estrgeno y progesterona. La terapia de estrgenos solamente se [place] para las mujeres que no tienen tero. Las siguientes son algunas de las opciones para Ardelia Mems TRH:  Pastillas.  Parches.  Geles.  Aerosoles.  Cremas vaginales.  Anillo vaginal.  Dispositivos vaginales. La cantidad de hormonas que tome y el tiempo que deba tomarlas vara en funcin de su salud. Es importante lo siguiente:  Comenzar una TRH con la dosis ms baja posible.  Interrumpa la TRH tan pronto como su mdico se lo indique.  Colabore con su mdico para estar bien informada y sentirse cmoda con sus decisiones.   Informe al mdico acerca de lo siguiente:  Cualquier alergia que tenga.  Si ha tenido cogulos de sangre o conoce algn factor de riesgo que pueda tener de formar cogulos de [place].  Si usted o sus familiares han tenido cncer, especialmente cncer de mamas, ovarios o tero.  Cirugas a las que se haya sometido.  Todos los [employer] [place], incluidos vitaminas, hierbas, gotas oftlmicas, cremas y medicamentos de venta libre.  Si est embarazada o podra  estarlo.  Cualquier afeccin mdica que tenga. Cules son los beneficios? La TRH puede reducir la frecuencia y la gravedad de los sntomas de la menopausia. Los beneficios de la TRH varan segn el tipo de sntomas que tenga, su gravedad y su salud general. La TRH puede ayudarle a mejorar los siguientes sntomas de la menopausia:  Acaloramiento y sudores nocturnos. Estos consisten en una sensacin de calor que se extiende por el rostro y el cuerpo. La piel se puede volver roja, como ruborizada. Los sudores nocturnos son acaloramientos que ocurren al estar dormida o tratando de dormir.  Prdida de masa sea (osteoporosis). El organismo pierde calcio ms rpido despus de la menopausia, lo que debilita a los [place]. Esto puede aumentar el riesgo de huesos rotos (fracturas).  Sequedad vaginal. La membrana que recubre la vagina puede volverse ms delgada y [place], lo que puede causar dolor durante las relaciones sexuales o causar infecciones, ardor o picazn.  Infecciones de las vas urinarias.  Incontinencia urinaria. Es la incapacidad de Chief Technology Officer cundo se [place].  Irritabilidad.  Problemas de memoria de [employer]. Cules son los riesgos? Los riesgos de la TRH pueden variar segn su salud y sus antecedentes mdicos. Los riesgos de la TRH tambin dependen de si usted recibe estrgeno y progesterona o solamente estrgeno. La TRH puede aumentar el riesgo de:  Tener prdidas. Estas ocurren cuando la vagina pierde repentinamente una pequea cantidad de [place].  Cncer de endometrio. Este cncer ocurre en la membrana del tero (endometrio).  Cncer de mama.  Aumenta la densidad del tejido de las [place]. Esto puede dificultar la deteccin de cncer de  mamas con una radiografa de mamas [facility]).  Accidente cerebrovascular.  Enfermedad cardaca.  Cogulos de [place].  Enfermedad de la vescula biliar o enfermedad heptica. Los riesgos de la TRH pueden ser [employer] si tiene alguna de las  siguientes afecciones:  Hotel manager de endometrio.  Enfermedad heptica.  Enfermedad cardaca.  Cncer de mama.  Antecedentes de cogulos sanguneos.  Antecedentes de accidente cerebrovascular. Siga estas instrucciones en su casa: Pruebas de Papanicolaou  Hgase pruebas de Papanicolaou con la frecuencia que le indique el mdico. A esta prueba tambin se la denomina "Pap". Es una prueba de deteccin que se [place] para detectar signos de cncer en el cuello uterino y la vagina. La prueba de Papanicolaou tambin puede identificar la presencia de infeccin o cambios precancerosos. Las pruebas de Papanicolaou se pueden realizar con la siguiente frecuencia: ? Cada 3 aos, a partir de los 21 aos. ? Cada 5 aos, a partir de los 30 aos, en combinacin con las pruebas que se realizan para Product manager presencia del virus del Engineer, technical sales (VPH). ? Con mayor o menor frecuencia segn las afecciones mdicas que tenga, su edad y otros factores de [place].  Es su responsabilidad retirar los [employer] de la prueba de Papanicolaou. Consulte al mdico o pregunte en el departamento donde se realiza la prueba cundo estarn [employer]. Indicaciones generales  Use los medicamentos de venta libre y los recetados solamente como se lo haya indicado el mdico.  No consuma ningn producto que contenga nicotina o tabaco. Estos productos incluyen cigarrillos, tabaco para Higher education careers adviser y aparatos de vapeo, como los Psychologist, sport and exercise. Si necesita ayuda para dejar de consumir estos productos, consulte al mdico.  Hgase mamografas, exmenes plvicos y chequeos con la frecuencia que le indique el mdico.  Cumpla con todas las visitas de seguimiento. Esto es importante. Comunquese con un mdico si tiene:  Dolor o hinchazn en las piernas.  Bultos o cambios en las mamas o en las [place].  Dolor, ardor o sangrado al [employer].  Hemorragia vaginal inusual.  Mareos o dolores de [place].  Dolor en el  abdomen. Solicite ayuda de inmediato si tiene:  Falta de aire.  Dolor en el pecho.  Hablas arrastrando las palabras.  Adormecimiento o debilidad en cualquier parte de los brazos o de las piernas. Estos sntomas pueden representar un problema grave que constituye Engineer, maintenance (IT). No espere a ver si los sntomas desaparecen. Solicite atencin mdica de inmediato. Comunquese con el servicio de emergencias de su localidad (911 en los [place]). No conduzca por sus propios medios [employer]. Resumen  La menopausia es la etapa normal de la vida cuando los perodos menstruales desaparecen y los ovarios dejan de producir las hormonas femeninas estrgeno y Immunologist.  La TRH puede reducir la frecuencia y la gravedad de los sntomas de la menopausia.  Los riesgos de la TRH pueden variar segn su salud y sus antecedentes mdicos. Esta informacin no tiene Marine scientist el consejo del mdico. Asegrese de hacerle al mdico cualquier pregunta que tenga. Document Revised: 01/05/2020 Document Reviewed: 01/05/2020 Elsevier Patient Education  [place].

## 2021-01-02 ENCOUNTER — Other Ambulatory Visit: Payer: Self-pay | Admitting: Physician Assistant

## 2021-01-02 DIAGNOSIS — Z1231 Encounter for screening mammogram for malignant neoplasm of breast: Secondary | ICD-10-CM

## 2021-01-17 ENCOUNTER — Other Ambulatory Visit: Payer: Self-pay

## 2021-01-17 ENCOUNTER — Ambulatory Visit (HOSPITAL_COMMUNITY)
Admission: RE | Admit: 2021-01-17 | Discharge: 2021-01-17 | Disposition: A | Discharge status: Home / Self Care | Payer: Self-pay | Source: Ambulatory Visit | Attending: Physician Assistant | Admitting: Physician Assistant

## 2021-01-17 DIAGNOSIS — Z1231 Encounter for screening mammogram for malignant neoplasm of breast: Secondary | ICD-10-CM | POA: Insufficient documentation

## 2021-05-20 ENCOUNTER — Ambulatory Visit: Payer: Self-pay | Admitting: Physician Assistant

## 2021-05-22 ENCOUNTER — Encounter: Payer: Self-pay | Admitting: Physician Assistant

## 2021-05-22 ENCOUNTER — Other Ambulatory Visit: Payer: Self-pay

## 2021-05-22 ENCOUNTER — Ambulatory Visit: Payer: Self-pay | Admitting: Physician Assistant

## 2021-05-22 VITALS — BP 140/81 | HR 56 | Temp 97.5°F | Wt 134.0 lb

## 2021-05-22 DIAGNOSIS — Z23 Encounter for immunization: Secondary | ICD-10-CM

## 2021-05-22 DIAGNOSIS — Z789 Other specified health status: Secondary | ICD-10-CM

## 2021-05-22 DIAGNOSIS — H6123 Impacted cerumen, bilateral: Secondary | ICD-10-CM

## 2021-05-22 NOTE — Progress Notes (Signed)
BP 140/81   Pulse (!) 56   Temp (!) 97.5 F (36.4 C)   Wt 134 lb (60.8 kg)   SpO2 96%   BMI 26.17 kg/m    Subjective:    Patient ID: Judy Mcdaniel, female    DOB: 1970/09/17, 50 y.o.   MRN: [id number]  HPI: Judy Mcdaniel is a 50 y.o. female presenting on 05/22/2021 for Follow-up   HPI   Pt is 27yoF who presents for routine follow up.    She says she is doing well overall.    She says she Sometimes gets burning in her hands which has been going on for many years.  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   She still has FIT test given to her in April for colon cancer screening  LMP  now    Relevant past medical, surgical, family and social history reviewed and updated as indicated. Interim medical history since our last visit reviewed. Allergies and medications reviewed and updated.   No current outpatient medications on file.    Review of Systems  Per HPI unless specifically indicated above     Objective:    BP 140/81   Pulse (!) 56   Temp (!) 97.5 F (36.4 C)   Wt 134 lb (60.8 kg)   SpO2 96%   BMI 26.17 kg/m   Wt Readings from Last 3 Encounters:  05/22/21 134 lb (60.8 kg)  09/27/20 136 lb (61.7 kg)  05/24/20 142 lb (64.4 kg)    Physical Exam Vitals reviewed.  Constitutional:      General: She is not in acute distress.    Appearance: She is well-developed. She is not ill-appearing.  HENT:     Head: Normocephalic and atraumatic.     Right Ear: External ear normal.     Left Ear: External ear normal.     Ears:     Comments: Cerumen bilateral ear canals Cardiovascular:     Rate and Rhythm: Normal rate and regular rhythm.  Pulmonary:     Effort: Pulmonary effort is normal.     Breath sounds: Normal breath sounds.  Abdominal:     General: Bowel sounds are normal.     Palpations: Abdomen is soft. There is no mass.     Tenderness: There is no abdominal tenderness.  Musculoskeletal:     Right wrist: Normal.      Left wrist: Normal.     Right hand: Normal. No swelling, deformity or tenderness. Normal range of motion. Normal capillary refill. Normal pulse.     Left hand: Normal. No swelling, deformity or tenderness. Normal range of motion. Normal capillary refill. Normal pulse.     Cervical back: Neck supple.     Right lower leg: No edema.     Left lower leg: No edema.     Comments: Nails bitten low on fingers both hands.  No secondary infection  Lymphadenopathy:     Cervical: No cervical adenopathy.  Skin:    General: Skin is warm and dry.  Neurological:     Mental Status: She is alert and oriented to person, place, and time.  Psychiatric:        Behavior: Behavior normal.     Comments: Pleasant, engaged          Assessment & Plan:    Encounter Diagnoses  Name Primary?   Influenza vaccination administered at current visit Yes   Not proficient in [place] language  Impacted cerumen, bilateral      -pt is given Flu shot today -pt had Ear lavage.  TMs normal after lavage -Pt conseled to avoid biting her nails -Dental list per pt request -pt encouraged to ice the hands/wrist for 10-20 minutes after work and use nsaid.  She is to contact office if her symptoms worsen or if new symptoms develop -BP up a bit today but has been good previously.  Will monitor -pt to F/u end of august.  She is to contact office sooner prn

## 2021-05-22 NOTE — Patient Instructions (Signed)
Vacuna antigripal (inactivada o recombinante): lo que debe saber Influenza (Flu) Vaccine (Inactivated or Recombinant): What You Need to Know 1. Por qu vacunarse? La vacuna antigripal puede prevenir la gripe. La gripe es una enfermedad contagiosa que se disemina en los [place] cada ao, por lo general, Lyndal Pulley octubre y [place]. Cualquier persona puede contraer gripe, pero es ms peligrosa para Education administrator. Los bebs y los nios pequeos, los Nordstrom de 41 aos y las Market researcher, as [employer] personas que tienen ciertas enfermedades o cuyo sistema inmunitario est debilitado, corren un riesgo mayor de tener complicaciones debido a la gripe. La neumona, la bronquitis, las infecciones de los senos paranasales y las infecciones de odos son ejemplos de complicaciones relacionadas con la gripe. Si tiene una afeccin, por ejemplo, enfermedad cardaca, cncer o diabetes, la gripe puede empeorarla. La gripe puede causar fiebre y escalofros, dolor de garganta, dolores musculares, fatiga, tos, dolor de [place] y secrecin o congestin nasal. Algunas personas pueden tener vmitos y [place], [place] esto es ms frecuente en los nios que en los adultos. En un ao promedio, miles de [employer] [place] debido a la gripe, y muchas ms deben ser hospitalizadas. Cada ao, la vacuna antigripal previene millones de enfermedades y evita visitas al mdico relacionadas con la gripe. [place] CDC (Centros para el Control y la Prevencin de [place]) recomiendan que todas las personas a Proofreader de los 6 meses de edad se vacunen cada temporada de gripe. Es posible que los nios de 6 meses a 8 aos deban recibir 2 dosis durante la misma temporada de gripe. Todas las dems personas tienen que aplicarse 1 sola dosis cada temporada de gripe. La vacuna comienza a surtir Administrator, Civil Service 2 semanas despus de su aplicacin. Hay muchos virus de la gripe, y Mining engineer. Cada ao, se elabora una nueva vacuna antigripal para brindar proteccin contra los virus gripales que se cree probablemente causen la enfermedad en la siguiente temporada de gripe. Incluso si la vacuna no es especfica para esos virus, aun as puede brindar cierta proteccin. La vacuna antigripal no causa gripe. La vacuna antigripal puede administrarse al mismo tiempo que otras vacunas. 3. Hable con el mdico Comunquese con la persona que le coloca las vacunas si la persona que la recibe: Ha tenido una reaccin alrgica despus de Ardelia Mems dosis previa de la vacuna antigripal o tiene alguna alergia grave, potencialmente mortal Alguna vez tuvo sndrome de Curator (tambin llamado "SGB") En algunos casos, es posible que el mdico decida posponer la aplicacin de la vacuna antigripal hasta una visita en el futuro. La vacuna antigripal puede administrarse en cualquier momento durante el embarazo. Las personas que estn o vayan a [place] durante la temporada de gripe deben recibir la vacuna antigripal inactivada. Las personas que sufren trastornos menores, como un resfro, pueden vacunarse. Las personas que tienen enfermedades moderadas o graves generalmente deben esperar hasta recuperarse para poder recibir la vacuna antigripal. Su mdico puede darle ms informacin. 4. Riesgos de [place] reaccin a la vacuna [place] de recibir la vacunacin antigripal, Automotive engineer, enrojecimiento e hinchazn en el lugar de la inyeccin, fiebre, dolores musculares y Social research officer, government de [place]. Puede haber un pequeo aumento del riesgo de sufrir sndrome de Curator (SGB) despus de la aplicacin de la vacuna antigripal inactivada. Los nios pequeos que reciben la vacuna antigripal junto con la vacuna antineumoccica (PCV13) o la DTaP en el mismo momento pueden tener una probabilidad un poco ms elevada  de tener una convulsin debido a la fiebre. Informe al mdico si un nio que est recibiendo  la vacuna antigripal ha tenido una convulsin alguna vez. Las personas a veces se desmayan despus de procedimientos mdicos, incluida la vacunacin. Informe al mdico si se siente mareado, tiene cambios en la visin o zumbidos en los odos. Al igual que con cualquier [employer], existe una probabilidad muy remota de que una vacuna cause una reaccin alrgica grave, otra lesin grave o la muerte. 5. Qu pasa si se presenta un problema grave? Podra producirse una reaccin alrgica despus de que la persona vacunada abandone la clnica. Si observa signos de Nurse, mental health grave (ronchas, hinchazn de la cara y la garganta, dificultad para respirar, latidos cardacos acelerados, mareos o debilidad), llame al 9-1-1 y lleve a la persona al hospital ms cercano. Si se presentan otros signos que le preocupan, comunquese con su mdico. Las reacciones adversas deben informarse al Sistema de Informe de Eventos Adversos de Clinical biochemist (VAERS). Por lo general, el mdico presenta este informe o puede hacerlo usted mismo. Visite el sitio web del VAERS en www.vaers.SamedayNews.es o llame al 832-083-4033. El VAERS es solo para Electrical engineer, y los miembros de su personal no proporcionan asesoramiento mdico. 6. Latimer de Compensacin de Daos por [place] de Compensacin de Daos por Clinical biochemist (National Vaccine Injury Fiserv, Runner, broadcasting/film/video) es un programa federal que fue creado para Patent examiner a las personas que puedan haber sufrido daos al recibir ciertas vacunas. Las Artist a presuntas lesiones o muerte debidas a la vacunacin tienen un lmite de tiempo para su presentacin, que puede ser de tan solo [employer]. Visite el sitio web del VICP en GoldCloset.com.ee o llame al 1-802-574-5198 para obtener ms informacin acerca del programa y de cmo presentar un reclamo. 7. Cmo puedo obtener ms informacin? Pregntele a su mdico. Comunquese con el  servicio de salud de su localidad o su estado. Visite el sitio web de Environmental manager) (Administracin de Alimentos y Chief Strategy Officer) para ver los prospectos de las vacunas e informacin adicional en TraderRating.uy. Comunquese con Garment/textile technologist for [employer]) (Centros para el Control y la Prevencin de [place]): Llame al (203)[phone] (1-800-CDC-INFO) o Visite el sitio web de los CDC en https://gibson.com/. Declaracin de informacin sobre la vacuna antigripal inactivada (01/20/2020) Esta informacin no tiene Marine scientist el consejo del mdico. Asegrese de hacerle al mdico cualquier pregunta que tenga. Document Revised: 02/21/2021 Document Reviewed: 02/21/2021 Elsevier Patient Education  2022 [employer].

## 2022-02-05 ENCOUNTER — Ambulatory Visit: Payer: Self-pay | Admitting: Physician Assistant

## 2022-03-17 IMAGING — MG MM DIGITAL SCREENING BILAT W/ TOMO AND CAD
6 of 10 series · 6 of 30 positions shown · non-contrast
Comparison: Previous exam(s).

CLINICAL DATA: Screening.

EXAM:
DIGITAL SCREENING BILATERAL MAMMOGRAM WITH TOMOSYNTHESIS AND CAD
TECHNIQUE: Bilateral screening digital craniocaudal and mediolateral oblique
mammograms were obtained. Bilateral screening digital breast
tomosynthesis was performed. The images were evaluated with
computer-aided detection.

[R CC synth-2D]
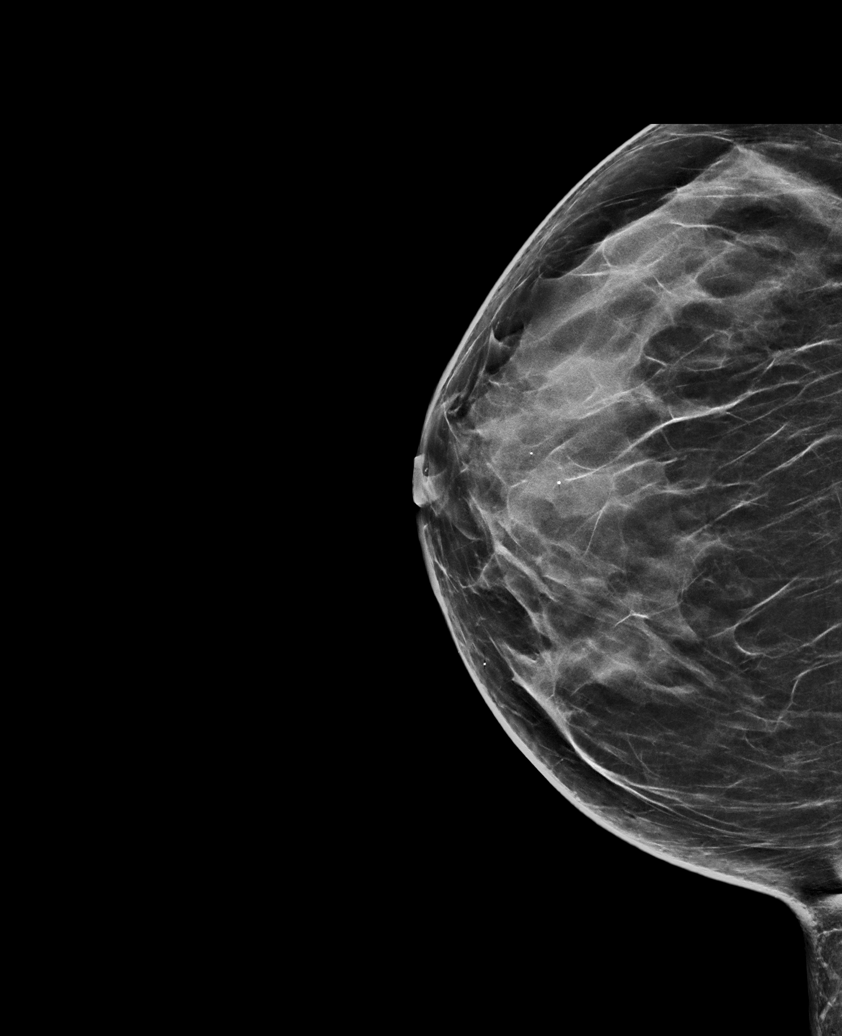

[L CC synth-2D (1 of 2)]
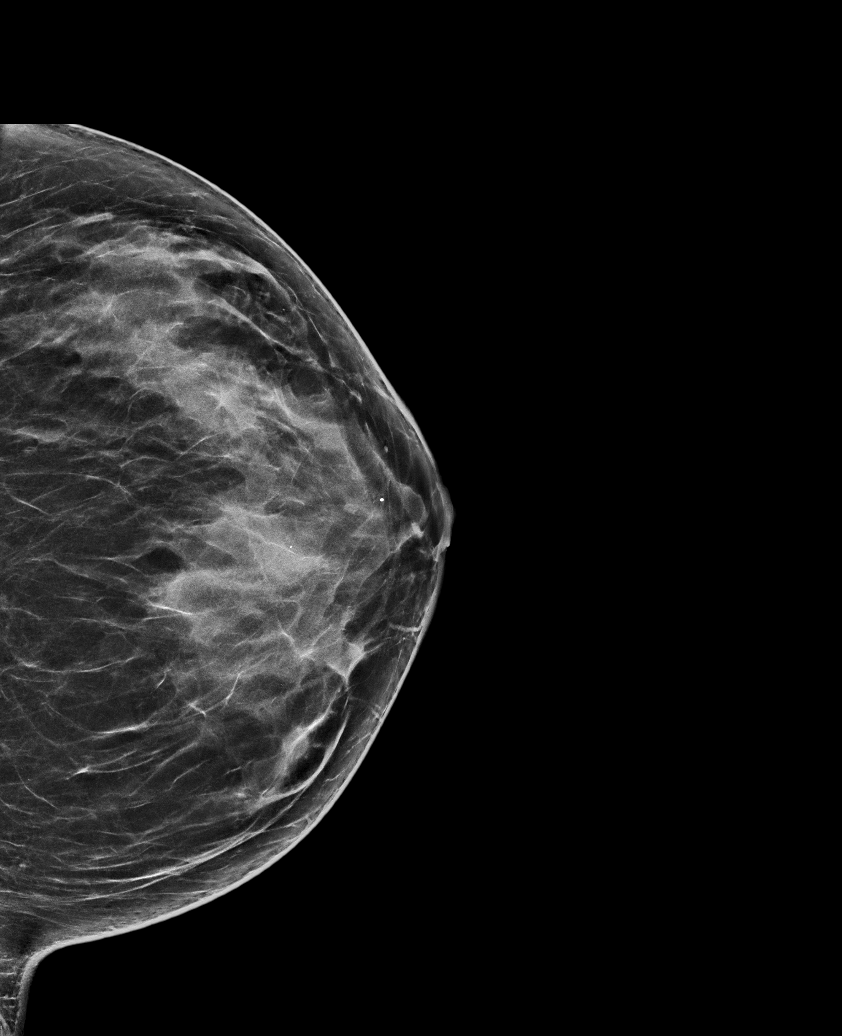

[L MLO synth-2D]
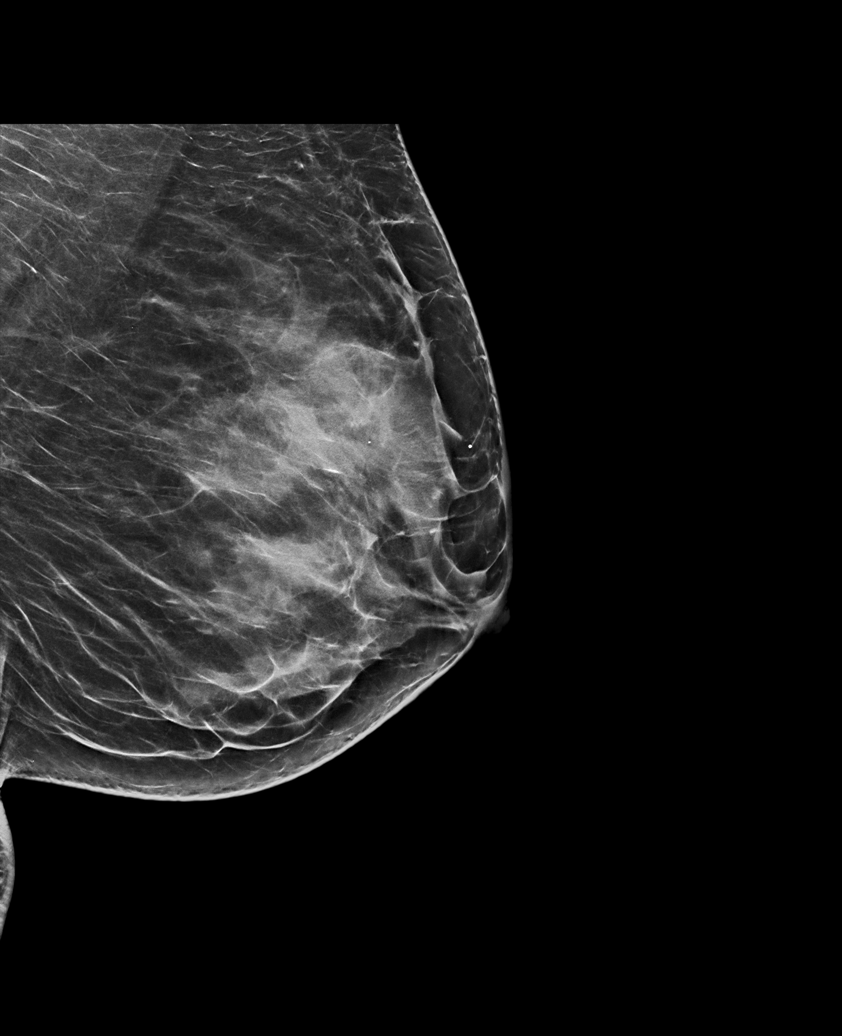

[L CC synth-2D (2 of 2)]
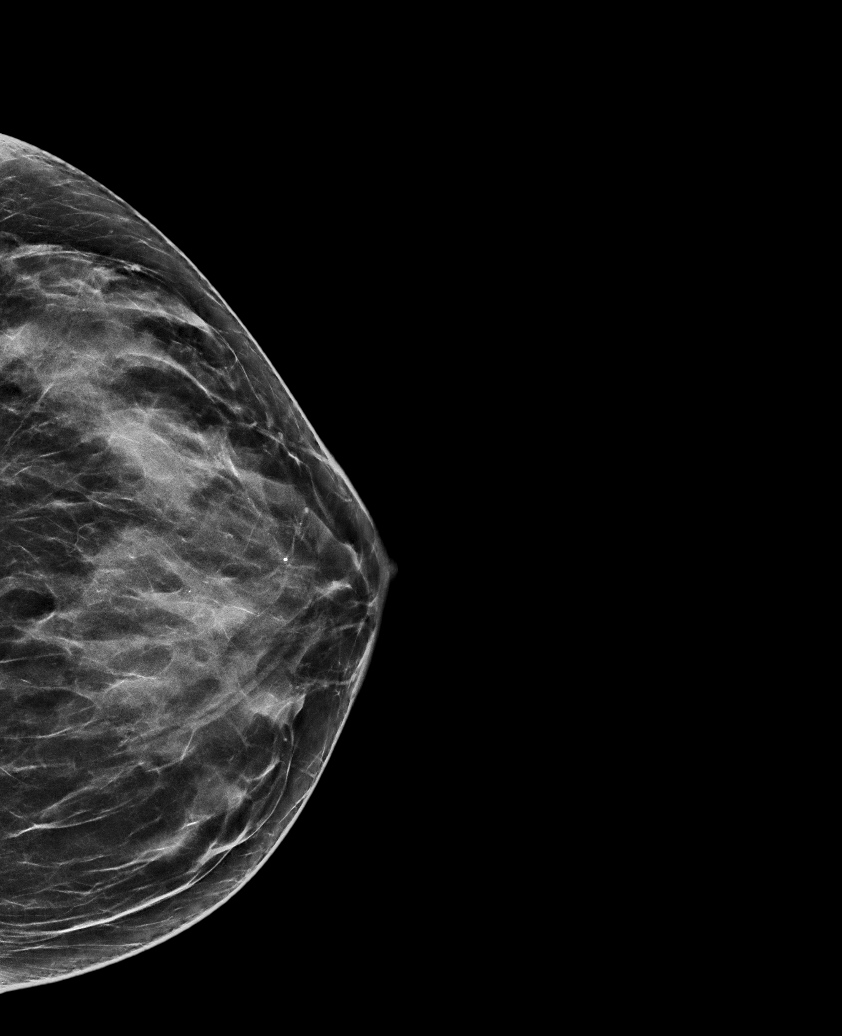

[R MLO synth-2D]
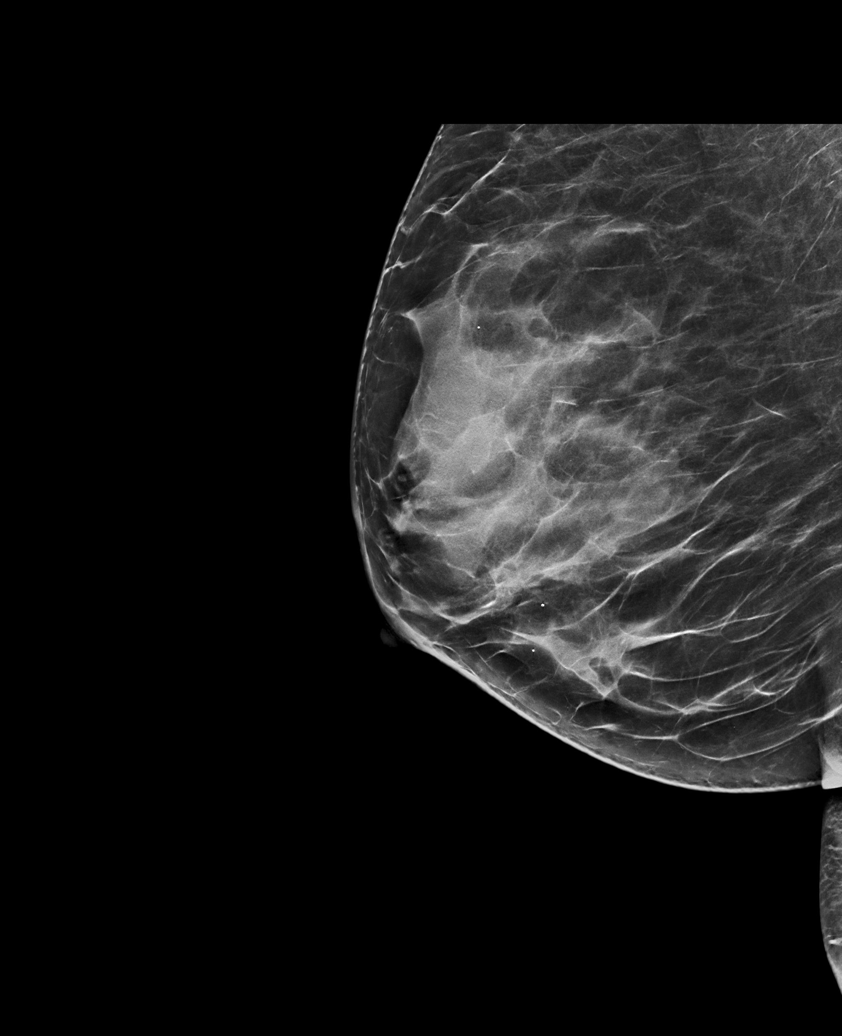

[R CC tomo · tomo slice 33/65.0]
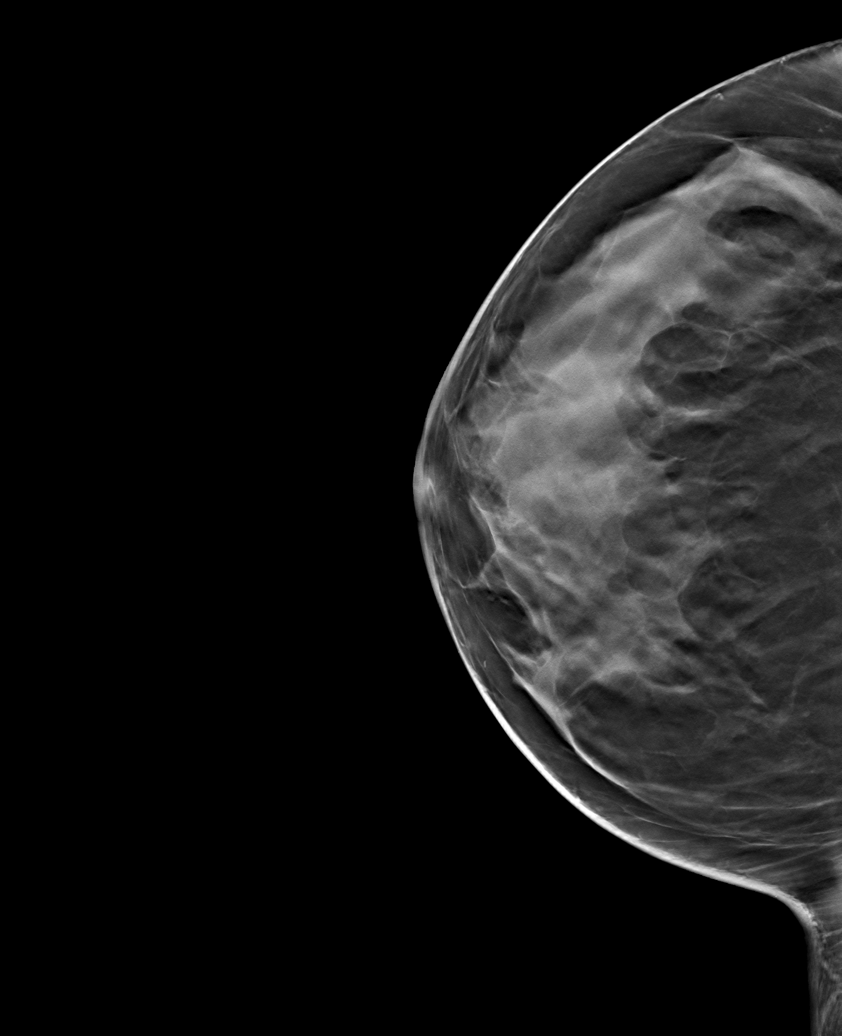

[6 of 30 positions shown; findings below may reference images not displayed]

ACR Breast Density Category c: The breast tissue is heterogeneously
dense, which may obscure small masses.
FINDINGS: There are no findings suspicious for malignancy.
IMPRESSION: No mammographic evidence of malignancy. A result letter of this
screening mammogram will be mailed directly to the patient.

RECOMMENDATION:
Screening mammogram in one year. (Code:Q3-W-BC3)

BI-RADS CATEGORY  1: Negative.

## 2022-05-14 ENCOUNTER — Telehealth: Payer: Self-pay

## 2022-05-14 NOTE — Telephone Encounter (Signed)
Telephoned patient at mobile number. Judy Mcdaniel (interpreter)  spoke with patient via telephone. Patient stated she has an appointment at [facility] on May 22, 2022. Patient declined BCCCP services.

## 2022-05-26 NOTE — Congregational Nurse Program (Signed)
Pt shopped at onsite [employer] on today for family of 3 as a return visit  Plan -Pt shopped and received 21.43 lbs of food

## 2023-01-15 ENCOUNTER — Encounter: Payer: Self-pay | Admitting: Neurology

## 2023-01-15 ENCOUNTER — Other Ambulatory Visit: Payer: Self-pay

## 2023-01-15 DIAGNOSIS — R202 Paresthesia of skin: Secondary | ICD-10-CM

## 2023-02-06 ENCOUNTER — Encounter: Payer: Self-pay | Admitting: Neurology

## 2023-02-12 ENCOUNTER — Ambulatory Visit: Payer: Self-pay | Admitting: Neurology

## 2023-03-12 ENCOUNTER — Ambulatory Visit: Payer: Self-pay | Admitting: Neurology

## 2023-03-12 DIAGNOSIS — R202 Paresthesia of skin: Secondary | ICD-10-CM

## 2023-03-12 DIAGNOSIS — G5603 Carpal tunnel syndrome, bilateral upper limbs: Secondary | ICD-10-CM

## 2023-03-12 NOTE — Procedures (Signed)
[facility]  [address] [place], [address]  [place], [place] [address] Tel: [phone] Fax: [phone] Test Date:  03/12/2023  Patient: Judy Mcdaniel DOB: [birth year] Physician: Nita Sickle, DO  Sex: Female Height: 5\' 0"  Ref Phys: Kathryne Gin, MD  ID#: [id number]   Technician:    History: This is a 52 year old female referred for evaluation of bilateral hand paresthesias and pain.  NCV & EMG Findings: Extensive electrodiagnostic testing of the right upper extremity and additional studies of the left shows:  Right median sensory response is absent.  Left median sensory response shows prolonged latency (5.9 ms) and reduced amplitude (9.4 V).  Bilateral ulnar sensory responses are within normal limits. Right median motor response shows severely prolonged latency (10.9 ms) and reduced amplitude (3.1 mV).  Left median motor response shows prolonged latency (6.8 ms) and normal amplitude.  Bilateral ulnar motor responses are within normal limits. Chronic motor axon loss changes are seen affecting the right abductor pollicis brevis muscle, without accompanying active denervation.  These findings are not present in the left upper extremity.  Impression: Bilateral median neuropathy at or distal to the wrist, consistent with a clinical diagnosis of carpal tunnel syndrome.  Overall, these findings are very severe on the right, and moderate-to-severe on the left.    ___________________________ Nita Sickle, DO    Nerve Conduction Studies   Stim Site NR Peak (ms) Norm Peak (ms) O-P Amp (V) Norm O-P Amp  Left Median Anti Sensory (2nd Digit)  32 C  Wrist    *5.9 <3.6 *9.4 >15  Right Median Anti Sensory (2nd Digit)  32 C  Wrist *NR  <3.6  >15  Left Ulnar Anti Sensory (5th Digit)  32 C  Wrist    3.1 <3.1 31.2 >10  Right Ulnar Anti Sensory (5th Digit)  32 C  Wrist    2.9 <3.1 32.3 >10     Stim Site NR Onset (ms) Norm Onset (ms) O-P Amp (mV) Norm O-P Amp Site1 Site2  Delta-0 (ms) Dist (cm) Vel (m/s) Norm Vel (m/s)  Left Median Motor (Abd Poll Brev)  32 C  Wrist    *6.8 <4.0 8.6 >6 Elbow Wrist 4.8 25.0 52 >50  Elbow    11.6  8.4         Right Median Motor (Abd Poll Brev)  32 C  Wrist    *10.9 <4.0 *3.1 >6 Elbow Wrist 5.4 28.0 52 >50  Elbow    16.3  3.0         Left Ulnar Motor (Abd Dig Minimi)  32 C  Wrist    2.8 <3.1 8.8 >7 B Elbow Wrist 3.2 20.0 63 >50  B Elbow    6.0  8.5  A Elbow B Elbow 1.7 10.0 59 >50  A Elbow    7.7  8.3         Right Ulnar Motor (Abd Dig Minimi)  32 C  Wrist    2.4 <3.1 9.1 >7 B Elbow Wrist 3.4 20.0 59 >50  B Elbow    5.8  8.9  A Elbow B Elbow 1.5 10.0 67 >50  A Elbow    7.3  8.6          Electromyography   Side Muscle Ins.Act Fibs Fasc Recrt Amp Dur Poly Activation Comment  Right 1stDorInt Nml Nml Nml Nml Nml Nml Nml Nml N/A  Right Abd Poll Brev Nml Nml Nml *2- *1+ *1+ *1+ Nml N/A  Right PronatorTeres Nml Nml  Nml Nml Nml Nml Nml Nml N/A  Right Biceps Nml Nml Nml Nml Nml Nml Nml Nml N/A  Right Triceps Nml Nml Nml Nml Nml Nml Nml Nml N/A  Right Deltoid Nml Nml Nml Nml Nml Nml Nml Nml N/A  Right Cervical Parasp Low Nml Nml Nml Nml Nml Nml Nml Nml N/A  Left 1stDorInt Nml Nml Nml Nml Nml Nml Nml Nml N/A  Left Abd Poll Brev Nml Nml Nml Nml Nml Nml Nml Nml N/A  Left PronatorTeres Nml Nml Nml Nml Nml Nml Nml Nml N/A  Left Biceps Nml Nml Nml Nml Nml Nml Nml Nml N/A  Left Triceps Nml Nml Nml Nml Nml Nml Nml Nml N/A  Left Deltoid Nml Nml Nml Nml Nml Nml Nml Nml N/A      Waveforms:

## 2024-04-07 ENCOUNTER — Ambulatory Visit: Payer: Self-pay

## 2024-04-07 DIAGNOSIS — Z23 Encounter for immunization: Secondary | ICD-10-CM

## 2024-04-12 ENCOUNTER — Telehealth: Payer: Self-pay

## 2024-04-12 NOTE — Telephone Encounter (Signed)
 Followed up with pt by way of daughter jeriyah granlund) to reach out to patient to see if she was able to get her medicines transferred from [facility] to [place], in which will help her with her expense of her medications that she states causes a hardship.    Pt daughter Hershall RAMAN) stated that her mom [employer] Safety [employer]) have not contacted her PCP to get meds sent [facility] nor have she completed the other option of getting her medications transferred from [facility] to [facility].  Daughter also stated that her mind had mentioned about possibly waiting to her next appt with the PCP [facility]  Plan Daughter Hershall) states she will relay this information as suggested, for her mom to plan to go ahead and contact [facility] to move forward with her on file and new medications to get transferred to [facility].  -Advised the importance of helping pt to stay connected with the pharmarcy resource that will best her her financially and to be able to stay on track with properly taking the meds  -Advised staying on track with taking her prescribed meds even more so for the purpose of getting started on the new treatment medication that her PCP at [facility] has recently prescribed (paroxetine mesylate 7.5mg ) so that her provider can know if the medicine is working properly
